# Patient Record
Sex: Male | Born: 1995 | Race: Black or African American | Hispanic: No | Marital: Married | Smoking: Never smoker
Health system: Southern US, Community
[De-identification: ages and names within clinical notes are randomized; demographics above are authoritative.]

## PROBLEM LIST (undated history)

## (undated) HISTORY — PX: HERNIA REPAIR: SHX51

## (undated) HISTORY — PX: EYE SURGERY: SHX253

---
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
## 2013-02-04 ENCOUNTER — Encounter (HOSPITAL_BASED_OUTPATIENT_CLINIC_OR_DEPARTMENT_OTHER): Payer: Self-pay | Admitting: *Deleted

## 2013-02-04 ENCOUNTER — Emergency Department (HOSPITAL_BASED_OUTPATIENT_CLINIC_OR_DEPARTMENT_OTHER): Payer: Medicaid Other

## 2013-02-04 ENCOUNTER — Emergency Department (HOSPITAL_BASED_OUTPATIENT_CLINIC_OR_DEPARTMENT_OTHER)
Admission: EM | Admit: 2013-02-04 | Discharge: 2013-02-04 | Disposition: A | Discharge status: Home / Self Care | Payer: Medicaid Other | Attending: Emergency Medicine | Admitting: Emergency Medicine

## 2013-02-04 DIAGNOSIS — M25569 Pain in unspecified knee: Secondary | ICD-10-CM | POA: Insufficient documentation

## 2013-02-04 DIAGNOSIS — Z87828 Personal history of other (healed) physical injury and trauma: Secondary | ICD-10-CM | POA: Insufficient documentation

## 2013-02-04 DIAGNOSIS — M25562 Pain in left knee: Secondary | ICD-10-CM

## 2013-02-04 MED ORDER — IBUPROFEN 800 MG PO TABS
ORAL_TABLET | ORAL | Status: AC
  Administered 2013-02-04: 800 mg
  Filled 2013-02-04: qty 1

## 2013-02-04 MED ORDER — DICLOFENAC SODIUM 75 MG PO TBEC: 75.0000 mg | DELAYED_RELEASE_TABLET | Freq: Two times a day (BID) | ORAL | Status: DC

## 2013-02-04 NOTE — ED Notes (Addendum)
Pt c/o of constant, throbbing lft medial knee pain that started 3 hrs ago. Pt was walking and heard a pop. Pt took a tylenol and percocet, pain was temporarily relieved. No swelling/bruising noted. No previous injury to left knee.

## 2013-02-04 NOTE — ED Provider Notes (Signed)
History     CSN: [id number]  Arrival date & time 02/04/13  2126   First MD Initiated Contact with Patient 02/04/13 2211      Chief Complaint  Patient presents with  . Knee Pain    (Consider location/radiation/quality/duration/timing/severity/associated sxs/prior treatment) Patient is a 17 y.o. male presenting with knee pain. The history is provided by the patient. No language interpreter was used.  Knee Pain Location:  Knee Injury: yes   Knee location:  L knee Pain details:    Quality:  Aching   Severity:  Moderate   Onset quality:  Gradual   Timing:  Constant Chronicity:  New Foreign body present:  No foreign bodies Ineffective treatments:  None tried Pt complains of pain in left knee.  Pt reports he has swelling and pain.  Pt reports he injured knee 2 years ago  History reviewed. No pertinent past medical history.  Past Surgical History  Procedure Laterality Date  . Eye surgery    . Hernia repair      No family history on file.  History  Substance Use Topics  . Smoking status: Never Smoker   . Smokeless tobacco: Not on file  . Alcohol Use: No      Review of Systems  Allergies  Mushroom ext cmplx(shiitake-reishi-mait) and Nutritional supplements  Home Medications   Current Outpatient Rx  Name  Route  Sig  Dispense  Refill  . Acetaminophen (TYLENOL 8 HOUR PO)   Oral   Take by mouth.         . Oxycodone-Acetaminophen (PERCOCET PO)   Oral   Take by mouth.         . promethazine (PHENERGAN) 25 MG tablet   Oral   Take 1 tablet (25 mg total) by mouth every 6 (six) hours as needed for nausea.   20 tablet   0     BP 130/78  Pulse 70  Temp(Src) 99.1 F (37.3 C) (Oral)  Resp 19  Ht 6' (1.829 m)  Wt 206 lb (93.441 kg)  BMI 27.93 kg/m2  SpO2 100%  Physical Exam  Vitals reviewed. Constitutional: He is oriented to person, place, and time. He appears well-developed and well-nourished.  HENT:  Head: Normocephalic.  Musculoskeletal: He  exhibits tenderness.  Swollen left knee,  From,  nv and ns intact  Neurological: He is alert and oriented to person, place, and time.  Skin: Skin is warm.  Psychiatric: He has a normal mood and affect.    ED Course  Procedures (including critical care time)  Labs Reviewed - No data to display Dg Knee Complete 4 Views Left  02/04/2013  *RADIOLOGY REPORT*  Clinical Data: Medial and anterior left knee pain and limited range of motion after hearing a pop.  LEFT KNEE - COMPLETE 4+ VIEW  Comparison: 03/16/2012  Findings: Patella alta, stable since previous study.  No significant effusion.  No evidence of acute fracture or subluxation.  No focal bone lesion or bone destruction.  Bone cortex and trabecular architecture appear intact.  No significant change since previous study.  IMPRESSION: Stable patella alta.  No acute fracture, dislocation, or effusion demonstrated.   Original Report Authenticated By: Burman Nieves, M.D.      No diagnosis found.    MDM  xrays reviewed with pt,   Pt has patella alta.   Pt has had multiple ED visits with multiple xrays         Elson Areas, PA-C 02/04/13 2303

## 2013-02-05 NOTE — ED Provider Notes (Signed)
Medical screening examination/treatment/procedure(s) were performed by non-physician practitioner and as supervising physician I was immediately available for consultation/collaboration.   Braedan Meuth III, MD 02/05/13 1026 

## 2013-03-29 ENCOUNTER — Encounter (HOSPITAL_BASED_OUTPATIENT_CLINIC_OR_DEPARTMENT_OTHER): Payer: Self-pay | Admitting: *Deleted

## 2013-03-29 ENCOUNTER — Emergency Department (HOSPITAL_BASED_OUTPATIENT_CLINIC_OR_DEPARTMENT_OTHER)
Admission: EM | Admit: 2013-03-29 | Discharge: 2013-03-29 | Disposition: A | Discharge status: Home / Self Care | Payer: Self-pay | Attending: Emergency Medicine | Admitting: Emergency Medicine

## 2013-03-29 DIAGNOSIS — H538 Other visual disturbances: Secondary | ICD-10-CM | POA: Insufficient documentation

## 2013-03-29 MED ORDER — FLUORESCEIN SODIUM 1 MG OP STRP
1.0000 | ORAL_STRIP | Freq: Once | OPHTHALMIC | Status: AC
  Administered 2013-03-29: 1 via OPHTHALMIC
  Filled 2013-03-29: qty 1

## 2013-03-29 MED ORDER — TETRACAINE HCL 0.5 % OP SOLN
2.0000 [drp] | Freq: Once | OPHTHALMIC | Status: AC
  Administered 2013-03-29: 2 [drp] via OPHTHALMIC
  Filled 2013-03-29: qty 2

## 2013-03-29 NOTE — ED Notes (Signed)
Called [facility] for Ophthamology requested by Langston Masker

## 2013-03-29 NOTE — ED Provider Notes (Signed)
Medical screening examination/treatment/procedure(s) were performed by non-physician practitioner and as supervising physician I was immediately available for consultation/collaboration.   Johnaton Sonneborn, MD 03/29/13 2252 

## 2013-03-29 NOTE — ED Notes (Signed)
Pt reports blurred vision, right eye, since 0700. Worse in sunlight. Denies pain. 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

## 2013-03-29 NOTE — ED Provider Notes (Signed)
History     CSN: [id number]  Arrival date & time 03/29/13  4098   First MD Initiated Contact with Patient 03/29/13 1831      Chief Complaint  Patient presents with  . Eye Problem    (Consider location/radiation/quality/duration/timing/severity/associated sxs/prior treatment) Patient is a 17 y.o. male presenting with eye problem. The history is provided by the patient. No language interpreter was used.  Eye Problem Location:  R eye Severity:  Mild Onset quality:  Sudden Timing:  Constant Chronicity:  New Relieved by:  Nothing Worsened by:  Nothing tried Associated symptoms: blurred vision and decreased vision     History reviewed. No pertinent past medical history.  Past Surgical History  Procedure Laterality Date  . Eye surgery    . Hernia repair      No family history on file.  History  Substance Use Topics  . Smoking status: Never Smoker   . Smokeless tobacco: Never Used  . Alcohol Use: No      Review of Systems  Eyes: Positive for blurred vision.  All other systems reviewed and are negative.    Allergies  Bee venom; Mushroom ext cmplx(shiitake-reishi-mait); and Nutritional supplements  Home Medications   Current Outpatient Rx  Name  Route  Sig  Dispense  Refill  . Acetaminophen (TYLENOL 8 HOUR PO)   Oral   Take by mouth.         . diclofenac (VOLTAREN) 75 MG EC tablet   Oral   Take 1 tablet (75 mg total) by mouth 2 (two) times daily.   14 tablet   0   . Oxycodone-Acetaminophen (PERCOCET PO)   Oral   Take by mouth.         . promethazine (PHENERGAN) 25 MG tablet   Oral   Take 1 tablet (25 mg total) by mouth every 6 (six) hours as needed for nausea.   20 tablet   0     BP 126/65  Pulse 65  Temp(Src) 99.2 F (37.3 C) (Oral)  Resp 16  Ht 6' (1.829 m)  Wt 208 lb (94.348 kg)  BMI 28.2 kg/m2  Physical Exam  Nursing note and vitals reviewed. Constitutional: He appears well-developed and well-nourished.  HENT:  Head:  Normocephalic and atraumatic.  Eyes: Conjunctivae, EOM and lids are normal. Pupils are equal, round, and reactive to light. No foreign bodies found. Right eye exhibits no chemosis, no discharge and no exudate. No foreign body present in the right eye.  Slit lamp exam:      The right eye shows no corneal abrasion, no corneal ulcer, no foreign body, no hyphema and no fluorescein uptake.  Neck: Normal range of motion. Neck supple.  Cardiovascular: Normal rate.   Musculoskeletal: Normal range of motion.  Neurological: He is alert.  Skin: Skin is warm.  Psychiatric: He has a normal mood and affect.    ED Course  Procedures (including critical care time)  Labs Reviewed - No data to display No results found.   1. Blurred vision, right eye       MDM  I discussed pt with Dr. Allyne Gee who will see and evaluate pt.          Lonia Skinner [place], PA-C 03/29/13 2003

## 2013-05-12 DIAGNOSIS — S8990XA Unspecified injury of unspecified lower leg, initial encounter: Secondary | ICD-10-CM | POA: Insufficient documentation

## 2013-05-12 DIAGNOSIS — G44309 Post-traumatic headache, unspecified, not intractable: Secondary | ICD-10-CM | POA: Insufficient documentation

## 2013-05-12 DIAGNOSIS — S99929A Unspecified injury of unspecified foot, initial encounter: Secondary | ICD-10-CM | POA: Insufficient documentation

## 2013-05-12 DIAGNOSIS — Y92410 Unspecified street and highway as the place of occurrence of the external cause: Secondary | ICD-10-CM | POA: Insufficient documentation

## 2013-05-12 DIAGNOSIS — S298XXA Other specified injuries of thorax, initial encounter: Secondary | ICD-10-CM | POA: Insufficient documentation

## 2013-05-12 DIAGNOSIS — Y9389 Activity, other specified: Secondary | ICD-10-CM | POA: Insufficient documentation

## 2013-05-13 ENCOUNTER — Emergency Department (HOSPITAL_COMMUNITY)
Admission: EM | Admit: 2013-05-13 | Discharge: 2013-05-13 | Disposition: A | Discharge status: Home / Self Care | Payer: No Typology Code available for payment source | Attending: Emergency Medicine | Admitting: Emergency Medicine

## 2013-05-13 ENCOUNTER — Emergency Department (HOSPITAL_COMMUNITY): Payer: Medicaid Other

## 2013-05-13 ENCOUNTER — Encounter (HOSPITAL_COMMUNITY): Payer: Self-pay | Admitting: Adult Health

## 2013-05-13 LAB — BASIC METABOLIC PANEL
Calcium: 9.7 mg/dL (ref 8.4–10.5)
GFR calc Af Amer: >90 mL/min (ref >90)
GFR calc non Af Amer: >90 mL/min (ref >90)
Potassium: 4.1 mEq/L (ref 3.5–5.1)
Sodium: 135 mEq/L (ref 135–145)

## 2013-05-13 LAB — CBC
MCH: 31.6 pg (ref 26.0–34.0)
MCHC: 34.9 g/dL (ref 30.0–36.0)
Platelets: 239 10*3/uL (ref 150–400)
RBC: 4.94 MIL/uL (ref 4.22–5.81)

## 2013-05-13 LAB — CG4 I-STAT (LACTIC ACID): Lactic Acid, Venous: 1.07 mmol/L (ref 0.5–2.2)

## 2013-05-13 MED ORDER — TRAMADOL HCL 50 MG PO TABS: 50.0000 mg | ORAL_TABLET | Freq: Four times a day (QID) | ORAL | Status: DC | PRN

## 2013-05-13 MED ORDER — IBUPROFEN 200 MG PO TABS
400.0000 mg | ORAL_TABLET | Freq: Once | ORAL | Status: AC
  Administered 2013-05-13: 400 mg via ORAL
  Filled 2013-05-13: qty 2

## 2013-05-13 MED ORDER — OXYCODONE-ACETAMINOPHEN 5-325 MG PO TABS
1.0000 | ORAL_TABLET | Freq: Once | ORAL | Status: AC
  Administered 2013-05-13: 1 via ORAL
  Filled 2013-05-13: qty 1
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
## 2013-05-13 NOTE — ED Notes (Signed)
Pt states he's taking frequent shallow breathes because a deep breathe cause too much pain.
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
## 2013-08-16 ENCOUNTER — Encounter (HOSPITAL_COMMUNITY): Payer: Self-pay

## 2013-08-16 ENCOUNTER — Emergency Department (HOSPITAL_COMMUNITY): Payer: Self-pay

## 2013-08-16 ENCOUNTER — Emergency Department (HOSPITAL_COMMUNITY)
Admission: EM | Admit: 2013-08-16 | Discharge: 2013-08-16 | Disposition: A | Discharge status: Home / Self Care | Payer: Medicaid Other | Attending: Emergency Medicine | Admitting: Emergency Medicine

## 2013-08-16 ENCOUNTER — Emergency Department (HOSPITAL_COMMUNITY): Payer: Medicaid Other

## 2013-08-16 DIAGNOSIS — R1033 Periumbilical pain: Secondary | ICD-10-CM | POA: Insufficient documentation

## 2013-08-16 DIAGNOSIS — R109 Unspecified abdominal pain: Secondary | ICD-10-CM

## 2013-08-16 DIAGNOSIS — R197 Diarrhea, unspecified: Secondary | ICD-10-CM | POA: Insufficient documentation

## 2013-08-16 DIAGNOSIS — R6884 Jaw pain: Secondary | ICD-10-CM

## 2013-08-16 LAB — URINALYSIS, ROUTINE W REFLEX MICROSCOPIC
Bilirubin Urine: NEGATIVE
Glucose, UA: NEGATIVE mg/dL
Ketones, ur: NEGATIVE mg/dL
Leukocytes, UA: NEGATIVE
Protein, ur: NEGATIVE mg/dL
Urobilinogen, UA: 0.2 mg/dL (ref 0.0–1.0)
pH: 6 (ref 5.0–8.0)

## 2013-08-16 LAB — CBC WITH DIFFERENTIAL/PLATELET
Basophils Absolute: 0 10*3/uL (ref 0.0–0.1)
Eosinophils Relative: 3 % (ref 0–5)
Lymphocytes Relative: 38 % (ref 12–46)
Neutro Abs: 2.9 10*3/uL (ref 1.7–7.7)
Neutrophils Relative %: 49 % (ref 43–77)
Platelets: 253 10*3/uL (ref 150–400)
RDW: 12.1 % (ref 11.5–15.5)
WBC: 5.8 10*3/uL (ref 4.0–10.5)

## 2013-08-16 LAB — BASIC METABOLIC PANEL
CO2: 23 mEq/L (ref 19–32)
Calcium: 10.7 mg/dL — ABNORMAL HIGH (ref 8.4–10.5)
Chloride: 102 mEq/L (ref 96–112)
Potassium: 4.2 mEq/L (ref 3.5–5.1)
Sodium: 137 mEq/L (ref 135–145)

## 2013-08-16 NOTE — ED Notes (Signed)
Pt c/o jaw pain x 2 days and lower abdominal pain w/ diarrhea x 4 days.  Pain score 6/10 and 2/10, respectively.  Sts "I woke up, yawned, heard a pop and now I'm having difficulty closing my mouth."

## 2013-08-16 NOTE — ED Provider Notes (Signed)
CSN: [id number]     Arrival date & time 08/16/13  1741 History   First MD Initiated Contact with Patient 08/16/13 2029     Chief Complaint  Patient presents with  . Jaw Pain  . Abdominal Pain  . Diarrhea   (Consider location/radiation/quality/duration/timing/severity/associated sxs/prior Treatment) HPI Comments: Patient presents with complaints of periumbilical abdominal pain which has been occurring off and on for the past 2 days. He denies any nausea, vomiting, diarrhea, constipation. He is having no fevers. He denies blood in his stool and denies any urinary complaints. He also has complaints of left jaw pain for the past 2 days. This started abruptly when he woke up 2 mornings ago and yawned. He felt a pop in his left jaw and had to manipulate the jaw to get it closed. It is now sore but states that his teeth line up properly and he is able to open and close his mouth with some discomfort.  Patient is a 17 y.o. male presenting with abdominal pain and diarrhea. The history is provided by the patient.  Abdominal Pain Pain location:  Periumbilical Pain quality: cramping   Pain radiates to:  Does not radiate Pain severity:  Moderate Onset quality:  Sudden Duration:  2 days Timing:  Intermittent Progression:  Resolved Chronicity:  New Relieved by:  Nothing Worsened by:  Nothing tried Ineffective treatments:  None tried Associated symptoms: diarrhea   Diarrhea Associated symptoms: abdominal pain     History reviewed. No pertinent past medical history. Past Surgical History  Procedure Laterality Date  . Eye surgery    . Hernia repair     History reviewed. No pertinent family history. History  Substance Use Topics  . Smoking status: Never Smoker   . Smokeless tobacco: Never Used  . Alcohol Use: No    Review of Systems  Gastrointestinal: Positive for abdominal pain and diarrhea.  All other systems reviewed and are negative.    Allergies  Bee venom and Mushroom ext  cmplx(shiitake-reishi-mait)  Home Medications   Current Outpatient Rx  Name  Route  Sig  Dispense  Refill  . Pseudoephedrine-APAP-DM (DAY-TIME PO)   Oral   Take 15 mLs by mouth every 4 (four) hours as needed (cold symptoms).          BP 136/79  Pulse 71  Temp(Src) 99.1 F (37.3 C) (Oral)  Resp 16  SpO2 100% Physical Exam  Nursing note and vitals reviewed. Constitutional: He is oriented to person, place, and time. He appears well-developed and well-nourished. No distress.  HENT:  Head: Normocephalic and atraumatic.  Mouth/Throat: Oropharynx is clear and moist.  The jaw appears grossly normal. There is no malocclusion of the teeth. There is mild tenderness to palpation over the left temporomandibular joint with no crepitus to  Neck: Normal range of motion. Neck supple.  Cardiovascular: Normal rate, regular rhythm and normal heart sounds.   No murmur heard. Pulmonary/Chest: Effort normal and breath sounds normal. No respiratory distress. He has no wheezes.  Abdominal: Soft. Bowel sounds are normal. He exhibits no distension. There is no tenderness.  Musculoskeletal: Normal range of motion. He exhibits no edema.  Neurological: He is alert and oriented to person, place, and time.  Skin: Skin is warm and dry. He is not diaphoretic.    ED Course  Procedures (including critical care time) Labs Review Labs Reviewed  CBC WITH DIFFERENTIAL  BASIC METABOLIC PANEL  URINALYSIS, ROUTINE W REFLEX MICROSCOPIC   Imaging Review Dg Orthopantogram  08/16/2013  CLINICAL DATA:  Popping sensation over the left jaw, no trauma  EXAM: ORTHOPANTOGRAM/PANORAMIC  COMPARISON:  None  FINDINGS: Evidence of previous root canal and crown placement at the lower left 1st molar. Mandibular condyles and rami appear unremarkable. No fracture identified. Visualized paranasal sinuses are clear.  IMPRESSION: No mandibular fracture or other acute abnormality identified.   Electronically Signed   By: Christiana Pellant M.D.   On: 08/16/2013 20:00    MDM  No diagnosis found. Patient presents here with complaints of jaw pain that appears to be related to a possible partial dislocation while yawning several days ago. His teeth line up appropriately and x-rays are negative. No further workup is indicated for this with the exception of anti-inflammatories and avoid chewing hard, tough foods  Is also complaining of abdominal pain that he had 2 days ago and then resolved. It returned today for a brief period of time and then went away again. Workup reveals no elevation of white count, normal urine, and normal electrolytes. I do not feel as though further workup is indicated into this at this point. I doubt appendicitis as there is no right lower quadrant tenderness and no white count. He will be discharged to home with instructions to return as needed if his symptoms worsen or change.    Geoffery Lyons, MD 08/16/13 [phone]

## 2013-11-01 ENCOUNTER — Emergency Department (HOSPITAL_COMMUNITY): Payer: Self-pay

## 2013-11-01 ENCOUNTER — Emergency Department (HOSPITAL_COMMUNITY)
Admission: EM | Admit: 2013-11-01 | Discharge: 2013-11-01 | Disposition: A | Discharge status: Home / Self Care | Payer: Self-pay | Attending: Emergency Medicine | Admitting: Emergency Medicine

## 2013-11-01 ENCOUNTER — Encounter (HOSPITAL_COMMUNITY): Payer: Self-pay | Admitting: Emergency Medicine

## 2013-11-01 DIAGNOSIS — R059 Cough, unspecified: Secondary | ICD-10-CM

## 2013-11-01 DIAGNOSIS — R079 Chest pain, unspecified: Secondary | ICD-10-CM | POA: Insufficient documentation

## 2013-11-01 DIAGNOSIS — R112 Nausea with vomiting, unspecified: Secondary | ICD-10-CM | POA: Insufficient documentation

## 2013-11-01 DIAGNOSIS — R05 Cough: Secondary | ICD-10-CM

## 2013-11-01 DIAGNOSIS — J069 Acute upper respiratory infection, unspecified: Secondary | ICD-10-CM | POA: Insufficient documentation

## 2013-11-01 MED ORDER — PSEUDOEPHEDRINE HCL ER 120 MG PO TB12: 120.0000 mg | ORAL_TABLET | Freq: Two times a day (BID) | ORAL | Status: DC

## 2013-11-01 NOTE — ED Notes (Signed)
Pt has a productive cough and chest wall pain for two days
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
## 2013-11-01 NOTE — ED Notes (Signed)
Pt transported to xray 

## 2013-11-01 NOTE — ED Provider Notes (Signed)
Medical screening examination/treatment/procedure(s) were performed by non-physician practitioner and as supervising physician I was immediately available for consultation/collaboration.  EKG Interpretation   None         Audree Camel, MD 11/01/13 2222

## 2013-11-01 NOTE — ED Notes (Signed)
Pt states 2 days ago he woke up with congestion  Pt states when he coughed it made his chest hurt  Pt states he has had a lot of phlegm and a sore throat  Pt states he vomited twice last night at work  Pt states his chest pain gets worse with cough

## 2014-01-23 ENCOUNTER — Emergency Department (HOSPITAL_COMMUNITY)
Admission: EM | Admit: 2014-01-23 | Discharge: 2014-01-23 | Disposition: A | Discharge status: Home / Self Care | Payer: Medicaid Other | Attending: Emergency Medicine | Admitting: Emergency Medicine

## 2014-01-23 ENCOUNTER — Encounter (HOSPITAL_COMMUNITY): Payer: Self-pay | Admitting: Emergency Medicine

## 2014-01-23 DIAGNOSIS — R69 Illness, unspecified: Secondary | ICD-10-CM

## 2014-01-23 DIAGNOSIS — J111 Influenza due to unidentified influenza virus with other respiratory manifestations: Secondary | ICD-10-CM

## 2014-01-23 DIAGNOSIS — R630 Anorexia: Secondary | ICD-10-CM | POA: Insufficient documentation

## 2014-01-23 DIAGNOSIS — G43909 Migraine, unspecified, not intractable, without status migrainosus: Secondary | ICD-10-CM

## 2014-01-23 LAB — I-STAT CHEM 8, ED
BUN: 12 mg/dL (ref 6–23)
Calcium, Ion: 1.06 mmol/L — ABNORMAL LOW (ref 1.12–1.23)
Chloride: 101 mEq/L (ref 96–112)
Creatinine, Ser: 1.2 mg/dL (ref 0.50–1.35)
Glucose, Bld: 84 mg/dL (ref 70–99)
HEMATOCRIT: 52 % (ref 39.0–52.0)
HEMOGLOBIN: 17.7 g/dL — AB (ref 13.0–17.0)
POTASSIUM: 3.7 meq/L (ref 3.7–5.3)
SODIUM: 136 meq/L — AB (ref 137–147)
TCO2: 22 mmol/L (ref 0–100)

## 2014-01-23 MED ORDER — METOCLOPRAMIDE HCL 5 MG/ML IJ SOLN
10.0000 mg | Freq: Once | INTRAMUSCULAR | Status: AC
  Administered 2014-01-23: 10 mg via INTRAVENOUS
  Filled 2014-01-23: qty 2

## 2014-01-23 MED ORDER — DEXAMETHASONE SODIUM PHOSPHATE 10 MG/ML IJ SOLN
10.0000 mg | Freq: Once | INTRAMUSCULAR | Status: AC
  Administered 2014-01-23: 10 mg via INTRAVENOUS
  Filled 2014-01-23: qty 1

## 2014-01-23 MED ORDER — KETOROLAC TROMETHAMINE 15 MG/ML IJ SOLN
30.0000 mg | Freq: Once | INTRAMUSCULAR | Status: AC
  Administered 2014-01-23: 30 mg via INTRAVENOUS
  Filled 2014-01-23: qty 2

## 2014-01-23 MED ORDER — DIPHENHYDRAMINE HCL 50 MG/ML IJ SOLN
25.0000 mg | Freq: Once | INTRAMUSCULAR | Status: AC
  Administered 2014-01-23: 25 mg via INTRAVENOUS
  Filled 2014-01-23: qty 1

## 2014-01-23 MED ORDER — SODIUM CHLORIDE 0.9 % IV BOLUS (SEPSIS)
1000.0000 mL | Freq: Once | INTRAVENOUS | Status: AC
  Administered 2014-01-23: 1000 mL via INTRAVENOUS

## 2014-01-23 NOTE — ED Notes (Signed)
Pt reports generalized body aches, HA, weakness and dizziness for the past week with nasal congestion. Pt a&o x4, ambulatory to triage, NAD noted at this time

## 2014-01-23 NOTE — Discharge Instructions (Signed)
Influenza, Adult Influenza ("the flu") is a viral infection of the respiratory tract. It occurs more often in winter months because people spend more time in close contact with one another. Influenza can make you feel very sick. Influenza easily spreads from person to person (contagious). CAUSES  Influenza is caused by a virus that infects the respiratory tract. You can catch the virus by breathing in droplets from an infected person's cough or sneeze. You can also catch the virus by touching something that was recently contaminated with the virus and then touching your mouth, nose, or eyes. SYMPTOMS  Symptoms typically last 4 to 10 days and may include:  Fever.  Chills.  Headache, body aches, and muscle aches.  Sore throat.  Chest discomfort and cough.  Poor appetite.  Weakness or feeling tired.  Dizziness.  Nausea or vomiting. DIAGNOSIS  Diagnosis of influenza is often made based on your history and a physical exam. A nose or throat swab test can be done to confirm the diagnosis. RISKS AND COMPLICATIONS You may be at risk for a more severe case of influenza if you smoke cigarettes, have diabetes, have chronic heart disease (such as heart failure) or lung disease (such as asthma), or if you have a weakened immune system. Elderly people and pregnant women are also at risk for more serious infections. The most common complication of influenza is a lung infection (pneumonia). Sometimes, this complication can require emergency medical care and may be life-threatening. PREVENTION  An annual influenza vaccination (flu shot) is the best way to avoid getting influenza. An annual flu shot is now routinely recommended for all adults in the [place] TREATMENT  In mild cases, influenza goes away on its own. Treatment is directed at relieving symptoms. For more severe cases, your caregiver may prescribe antiviral medicines to shorten the sickness. Antibiotic medicines are not effective, because the  infection is caused by a virus, not by bacteria. HOME CARE INSTRUCTIONS  Only take over-the-counter or prescription medicines for pain, discomfort, or fever as directed by your caregiver.  Use a cool mist humidifier to make breathing easier.  Get plenty of rest until your temperature returns to normal. This usually takes 3 to 4 days.  Drink enough fluids to keep your urine clear or pale yellow.  Cover your mouth and nose when coughing or sneezing, and wash your hands well to avoid spreading the virus.  Stay home from work or school until your fever has been gone for at least 1 full day. SEEK MEDICAL CARE IF:   You have chest pain or a deep cough that worsens or produces more mucus.  You have nausea, vomiting, or diarrhea. SEEK IMMEDIATE MEDICAL CARE IF:   You have difficulty breathing, shortness of breath, or your skin or nails turn bluish.  You have severe neck pain or stiffness.  You have a severe headache, facial pain, or earache.  You have a worsening or recurring fever.  You have nausea or vomiting that cannot be controlled. MAKE SURE YOU:  Understand these instructions.  Will watch your condition.  Will get help right away if you are not doing well or get worse. Document Released: 11/05/2000 Document Revised: 05/09/2012 Document Reviewed: 02/07/2012 [facility] Patient Information 2014 [place], [place]. Migraine Headache A migraine headache is an intense, throbbing pain on one or both sides of your head. A migraine can last for 30 minutes to several hours. CAUSES  The exact cause of a migraine headache is not always known. However, a migraine may  be caused when nerves in the brain become irritated and release chemicals that cause inflammation. This causes pain. Certain things may also trigger migraines, such as:  Alcohol.  Smoking.  Stress.  Menstruation.  Aged cheeses.  Foods or drinks that contain nitrates, glutamate, aspartame, or tyramine.  Lack of  sleep.  Chocolate.  Caffeine.  Hunger.  Physical exertion.  Fatigue.  Medicines used to treat chest pain (nitroglycerine), birth control pills, estrogen, and some blood pressure medicines. SIGNS AND SYMPTOMS  Pain on one or both sides of your head.  Pulsating or throbbing pain.  Severe pain that prevents daily activities.  Pain that is aggravated by any physical activity.  Nausea, vomiting, or both.  Dizziness.  Pain with exposure to bright lights, loud noises, or activity.  General sensitivity to bright lights, loud noises, or smells. Before you get a migraine, you may get warning signs that a migraine is coming (aura). An aura may include:  Seeing flashing lights.  Seeing bright spots, halos, or zig-zag lines.  Having tunnel vision or blurred vision.  Having feelings of numbness or tingling.  Having trouble talking.  Having muscle weakness. DIAGNOSIS  A migraine headache is often diagnosed based on:  Symptoms.  Physical exam.  A CT scan or MRI of your head. These imaging tests cannot diagnose migraines, but they can help rule out other causes of headaches. TREATMENT Medicines may be given for pain and nausea. Medicines can also be given to help prevent recurrent migraines.  HOME CARE INSTRUCTIONS  Only take over-the-counter or prescription medicines for pain or discomfort as directed by your health care provider. The use of long-term narcotics is not recommended.  Lie down in a dark, quiet room when you have a migraine.  Keep a journal to find out what may trigger your migraine headaches. For example, write down:  What you eat and drink.  How much sleep you get.  Any change to your diet or medicines.  Limit alcohol consumption.  Quit smoking if you smoke.  Get 7 9 hours of sleep, or as recommended by your health care provider.  Limit stress.  Keep lights dim if bright lights bother you and make your migraines worse. SEEK IMMEDIATE MEDICAL  CARE IF:   Your migraine becomes severe.  You have a fever.  You have a stiff neck.  You have vision loss.  You have muscular weakness or loss of muscle control.  You start losing your balance or have trouble walking.  You feel faint or pass out.  You have severe symptoms that are different from your first symptoms. MAKE SURE YOU:   Understand these instructions.  Will watch your condition.  Will get help right away if you are not doing well or get worse. Document Released: 11/08/2005 Document Revised: 08/29/2013 Document Reviewed: 07/16/2013 [facility] Patient Information 2014 [place], [place].

## 2014-01-23 NOTE — ED Notes (Signed)
MD at bedside at this time.

## 2014-01-23 NOTE — ED Notes (Signed)
Pt's mother called concerned about patients wait and why he was still in the waiting room, it was explained to her that we were doing the best we can and that we are watching the waiting room and the patients.
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
## 2014-08-11 ENCOUNTER — Encounter (HOSPITAL_BASED_OUTPATIENT_CLINIC_OR_DEPARTMENT_OTHER): Payer: Self-pay | Admitting: Emergency Medicine

## 2014-08-11 ENCOUNTER — Emergency Department (HOSPITAL_BASED_OUTPATIENT_CLINIC_OR_DEPARTMENT_OTHER)
Admission: EM | Admit: 2014-08-11 | Discharge: 2014-08-11 | Disposition: A | Discharge status: Home / Self Care | Payer: Medicaid Other | Attending: Emergency Medicine | Admitting: Emergency Medicine

## 2014-08-11 DIAGNOSIS — Z79899 Other long term (current) drug therapy: Secondary | ICD-10-CM | POA: Insufficient documentation

## 2014-08-11 DIAGNOSIS — S139XXA Sprain of joints and ligaments of unspecified parts of neck, initial encounter: Secondary | ICD-10-CM | POA: Insufficient documentation

## 2014-08-11 DIAGNOSIS — Y939 Activity, unspecified: Secondary | ICD-10-CM | POA: Insufficient documentation

## 2014-08-11 DIAGNOSIS — M542 Cervicalgia: Secondary | ICD-10-CM | POA: Insufficient documentation

## 2014-08-11 DIAGNOSIS — Y929 Unspecified place or not applicable: Secondary | ICD-10-CM | POA: Insufficient documentation

## 2014-08-11 DIAGNOSIS — X58XXXA Exposure to other specified factors, initial encounter: Secondary | ICD-10-CM | POA: Insufficient documentation

## 2014-08-11 DIAGNOSIS — S161XXA Strain of muscle, fascia and tendon at neck level, initial encounter: Secondary | ICD-10-CM

## 2014-08-11 MED ORDER — IBUPROFEN 600 MG PO TABS: 600.0000 mg | ORAL_TABLET | Freq: Four times a day (QID) | ORAL | Status: DC | PRN

## 2014-08-11 MED ORDER — IBUPROFEN 400 MG PO TABS
600.0000 mg | ORAL_TABLET | Freq: Once | ORAL | Status: AC
  Administered 2014-08-11: 600 mg via ORAL
  Filled 2014-08-11 (×2): qty 1

## 2014-08-11 MED ORDER — METHOCARBAMOL 500 MG PO TABS
1000.0000 mg | ORAL_TABLET | Freq: Once | ORAL | Status: AC
  Administered 2014-08-11: 1000 mg via ORAL
  Filled 2014-08-11: qty 2

## 2014-08-11 MED ORDER — METHOCARBAMOL 500 MG PO TABS: 1000.0000 mg | ORAL_TABLET | Freq: Two times a day (BID) | ORAL | Status: DC

## 2014-08-11 NOTE — Discharge Instructions (Signed)
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
## 2014-08-11 NOTE — ED Notes (Addendum)
Pt reports neck pain x2 days, Denies fever, no n/v/d noted. Pt denies injury. Pt states he is with his family members who are here to be seen as well.

## 2014-11-17 ENCOUNTER — Emergency Department: Payer: Self-pay | Admitting: Emergency Medicine

## 2014-11-21 ENCOUNTER — Emergency Department: Payer: Self-pay | Admitting: Student

## 2014-11-23 LAB — BETA STREP CULTURE(ARMC)

## 2015-06-21 ENCOUNTER — Emergency Department: Payer: Medicaid Other

## 2015-06-21 ENCOUNTER — Encounter: Payer: Self-pay | Admitting: Emergency Medicine

## 2015-06-21 ENCOUNTER — Emergency Department
Admission: EM | Admit: 2015-06-21 | Discharge: 2015-06-21 | Disposition: A | Discharge status: Home / Self Care | Payer: Medicaid Other | Attending: Emergency Medicine | Admitting: Emergency Medicine

## 2015-06-21 ENCOUNTER — Emergency Department: Payer: No Typology Code available for payment source

## 2015-06-21 DIAGNOSIS — S4991XA Unspecified injury of right shoulder and upper arm, initial encounter: Secondary | ICD-10-CM | POA: Insufficient documentation

## 2015-06-21 DIAGNOSIS — Y92410 Unspecified street and highway as the place of occurrence of the external cause: Secondary | ICD-10-CM | POA: Insufficient documentation

## 2015-06-21 DIAGNOSIS — Y9389 Activity, other specified: Secondary | ICD-10-CM | POA: Insufficient documentation

## 2015-06-21 DIAGNOSIS — Y998 Other external cause status: Secondary | ICD-10-CM | POA: Insufficient documentation

## 2015-06-21 DIAGNOSIS — S8991XA Unspecified injury of right lower leg, initial encounter: Secondary | ICD-10-CM | POA: Insufficient documentation

## 2015-06-21 DIAGNOSIS — Z79899 Other long term (current) drug therapy: Secondary | ICD-10-CM | POA: Insufficient documentation

## 2015-06-21 DIAGNOSIS — Z720 Tobacco use: Secondary | ICD-10-CM | POA: Insufficient documentation

## 2015-06-21 DIAGNOSIS — M79604 Pain in right leg: Secondary | ICD-10-CM

## 2015-06-21 DIAGNOSIS — S99911A Unspecified injury of right ankle, initial encounter: Secondary | ICD-10-CM | POA: Insufficient documentation

## 2015-06-21 MED ORDER — CYCLOBENZAPRINE HCL 10 MG PO TABS: 10.0000 mg | ORAL_TABLET | Freq: Three times a day (TID) | ORAL | Status: DC | PRN

## 2015-06-21 MED ORDER — NAPROXEN 500 MG PO TBEC: 500.0000 mg | DELAYED_RELEASE_TABLET | Freq: Two times a day (BID) | ORAL | Status: DC
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
## 2015-06-21 NOTE — Discharge Instructions (Signed)
Musculoskeletal Pain °Musculoskeletal pain is muscle and boney aches and pains. These pains can occur in any part of the body. Your caregiver may treat you without knowing the cause of the pain. They may treat you if blood or urine tests, X-rays, and other tests were normal.  °CAUSES °There is often not a definite cause or reason for these pains. These pains may be caused by a type of germ (virus). The discomfort may also come from overuse. Overuse includes working out too hard when your body is not fit. Boney aches also come from weather changes. Bone is sensitive to atmospheric pressure changes. °HOME CARE INSTRUCTIONS  °· Ask when your test results will be ready. Make sure you get your test results. °· Only take over-the-counter or prescription medicines for pain, discomfort, or fever as directed by your caregiver. If you were given medications for your condition, do not drive, operate machinery or power tools, or sign legal documents for 24 hours. Do not drink alcohol. Do not take sleeping pills or other medications that may interfere with treatment. °· Continue all activities unless the activities cause more pain. When the pain lessens, slowly resume normal activities. Gradually increase the intensity and duration of the activities or exercise. °· During periods of severe pain, bed rest may be helpful. Lay or sit in any position that is comfortable. °· Putting ice on the injured area. °¨ Put ice in a bag. °¨ Place a towel between your skin and the bag. °¨ Leave the ice on for 15 to 20 minutes, 3 to 4 times a day. °· Follow up with your caregiver for continued problems and no reason can be found for the pain. If the pain becomes worse or does not go away, it may be necessary to repeat tests or do additional testing. Your caregiver may need to look further for a possible cause. °SEEK IMMEDIATE MEDICAL CARE IF: °· You have pain that is getting worse and is not relieved by medications. °· You develop chest pain  that is associated with shortness or breath, sweating, feeling sick to your stomach (nauseous), or throw up (vomit). °· Your pain becomes localized to the abdomen. °· You develop any new symptoms that seem different or that concern you. °MAKE SURE YOU:  °· Understand these instructions. °· Will watch your condition. °· Will get help right away if you are not doing well or get worse. °Document Released: 11/08/2005 Document Revised: 01/31/2012 Document Reviewed: 07/13/2013 °[employer]® Patient Information ©2015 [employer]. This information is not intended to replace advice given to you by your health care provider. Make sure you discuss any questions you have with your health care provider. ° °Motor Vehicle Collision °It is common to have multiple bruises and sore muscles after a motor vehicle collision (MVC). These tend to feel worse for the first 24 hours. You may have the most stiffness and soreness over the first several hours. You may also feel worse when you wake up the first morning after your collision. After this point, you will usually begin to improve with each day. The speed of improvement often depends on the severity of the collision, the number of injuries, and the location and nature of these injuries. °HOME CARE INSTRUCTIONS °· Put ice on the injured area. °¨ Put ice in a plastic bag. °¨ Place a towel between your skin and the bag. °¨ Leave the ice on for 15-20 minutes, 3-4 times a day, or as directed by your health care provider. °· Drink   enough fluids to keep your urine clear or pale yellow. Do not drink alcohol. °· Take a warm shower or bath once or twice a day. This will increase blood flow to sore muscles. °· You may return to activities as directed by your caregiver. Be careful when lifting, as this may aggravate neck or back pain. °· Only take over-the-counter or prescription medicines for pain, discomfort, or fever as directed by your caregiver. Do not use aspirin. This may increase bruising  and bleeding. °SEEK IMMEDIATE MEDICAL CARE IF: °· You have numbness, tingling, or weakness in the arms or legs. °· You develop severe headaches not relieved with medicine. °· You have severe neck pain, especially tenderness in the middle of the back of your neck. °· You have changes in bowel or bladder control. °· There is increasing pain in any area of the body. °· You have shortness of breath, light-headedness, dizziness, or fainting. °· You have chest pain. °· You feel sick to your stomach (nauseous), throw up (vomit), or sweat. °· You have increasing abdominal discomfort. °· There is blood in your urine, stool, or vomit. °· You have pain in your shoulder (shoulder strap areas). °· You feel your symptoms are getting worse. °MAKE SURE YOU: °· Understand these instructions. °· Will watch your condition. °· Will get help right away if you are not doing well or get worse. °Document Released: 11/08/2005 Document Revised: 03/25/2014 Document Reviewed: 04/07/2011 °[employer]® Patient Information ©2015 [employer]. This information is not intended to replace advice given to you by your health care provider. Make sure you discuss any questions you have with your health care provider. ° °

## 2015-08-14 ENCOUNTER — Encounter: Payer: Self-pay | Admitting: *Deleted

## 2015-08-14 DIAGNOSIS — Z791 Long term (current) use of non-steroidal anti-inflammatories (NSAID): Secondary | ICD-10-CM | POA: Insufficient documentation

## 2015-08-14 DIAGNOSIS — Z79899 Other long term (current) drug therapy: Secondary | ICD-10-CM | POA: Insufficient documentation

## 2015-08-14 DIAGNOSIS — R51 Headache: Secondary | ICD-10-CM | POA: Insufficient documentation

## 2015-08-14 DIAGNOSIS — G8929 Other chronic pain: Secondary | ICD-10-CM | POA: Insufficient documentation

## 2015-08-14 NOTE — ED Notes (Signed)
Pt reports back of head feels "hot", it will come intermittently and then has had headache since.

## 2015-08-15 ENCOUNTER — Emergency Department
Admission: EM | Admit: 2015-08-15 | Discharge: 2015-08-15 | Disposition: A | Discharge status: Home / Self Care | Payer: Medicaid Other | Attending: Emergency Medicine | Admitting: Emergency Medicine

## 2015-08-15 DIAGNOSIS — R51 Headache: Secondary | ICD-10-CM

## 2015-08-15 DIAGNOSIS — R519 Headache, unspecified: Secondary | ICD-10-CM

## 2015-08-15 MED ORDER — OXYCODONE-ACETAMINOPHEN 5-325 MG PO TABS
1.0000 | ORAL_TABLET | Freq: Once | ORAL | Status: AC
  Administered 2015-08-15: 1 via ORAL
  Filled 2015-08-15: qty 1

## 2015-08-15 NOTE — ED Provider Notes (Signed)
[facility] Emergency Department Provider Note  ____________________________________________  Time seen: 3:00 AM  I have reviewed the triage vital signs and the nursing notes.   HISTORY  Chief Complaint Headache      HPI Cameron Moore is a 19 y.o. male presents with history of chronic headaches however this headache in particular his been there for the past 4 days per patient. Patient states his current pain score is 4 out of 10 and located in the occipital region of his head. Patient states at times he feels hot or cold on and off on the back of his head. Patient denies any weakness no numbness no nausea no vomiting patient does however admit to positive photophobia.     Past medical history None There are no active problems to display for this patient.   Past Surgical History  Procedure Laterality Date  . Eye surgery    . Hernia repair      Current Outpatient Rx  Name  Route  Sig  Dispense  Refill  . cyclobenzaprine (FLEXERIL) 10 MG tablet   Oral   Take 1 tablet (10 mg total) by mouth every 8 (eight) hours as needed for muscle spasms.   30 tablet   1   . DM-Phenylephrine-Acetaminophen (VICKS DAYQUIL MULTI-SYMPTOM) 10-5-325 MG/15ML LIQD   Oral   Take 30 mLs by mouth every 6 (six) hours as needed (cough).         Marland Kitchen ibuprofen (ADVIL,MOTRIN) 600 MG tablet   Oral   Take 1 tablet (600 mg total) by mouth every 6 (six) hours as needed.   30 tablet   0   . methocarbamol (ROBAXIN) 500 MG tablet   Oral   Take 2 tablets (1,000 mg total) by mouth 2 (two) times daily.   30 tablet   0   . naproxen (EC NAPROSYN) 500 MG EC tablet   Oral   Take 1 tablet (500 mg total) by mouth 2 (two) times daily with a meal.   60 tablet   0     Allergies Bee venom and Mushroom ext cmplx(shiitake-reishi-mait)  Family History  Problem Relation Age of Onset  . Diabetes Mother     Social History Social History  Substance Use Topics  . Smoking  status: Never Smoker   . Smokeless tobacco: Never Used  . Alcohol Use: Yes    Review of Systems  Constitutional: Negative for fever. Eyes: Negative for visual changes. ENT: Negative for sore throat. Cardiovascular: Negative for chest pain. Respiratory: Negative for shortness of breath. Gastrointestinal: Negative for abdominal pain, vomiting and diarrhea. Genitourinary: Negative for dysuria. Musculoskeletal: Negative for back pain. Skin: Negative for rash. Neurological: Negative for headaches, focal weakness or numbness.   10-point ROS otherwise negative.  ____________________________________________   PHYSICAL EXAM:  VITAL SIGNS: ED Triage Vitals  Enc Vitals Group     BP 08/14/15 2334 135/76 mmHg     Pulse Rate 08/14/15 2334 69     Resp 08/14/15 2334 18     Temp 08/14/15 2334 98.6 F (37 C)     Temp Source 08/14/15 2334 Oral     SpO2 08/14/15 2334 98 %     Weight 08/14/15 2334 280 lb (127.007 kg)     Height 08/14/15 2334 6' (1.829 m)     Head Cir --      Peak Flow --      Pain Score 08/14/15 2336 6     Pain Loc --  Pain Edu? --      Excl. in GC? --     Constitutional: Alert and oriented. Well appearing and in no distress. Eyes: Conjunctivae are normal. PERRL. Normal extraocular movements. ENT   Head: Normocephalic and atraumatic.   Nose: No congestion/rhinnorhea.   Mouth/Throat: Mucous membranes are moist.   Neck: No stridor. Hematological/Lymphatic/Immunilogical: No cervical lymphadenopathy. Cardiovascular: Normal rate, regular rhythm. Normal and symmetric distal pulses are present in all extremities. No murmurs, rubs, or gallops. Respiratory: Normal respiratory effort without tachypnea nor retractions. Breath sounds are clear and equal bilaterally. No wheezes/rales/rhonchi. Gastrointestinal: Soft and nontender. No distention. There is no CVA tenderness. Genitourinary: deferred Musculoskeletal: Nontender with normal range of motion in all  extremities. No joint effusions.  No lower extremity tenderness nor edema. Neurologic:  Normal speech and language. No gross focal neurologic deficits are appreciated. Speech is normal.  Skin:  Skin is warm, dry and intact. No rash noted. Psychiatric: Mood and affect are normal. Speech and behavior are normal. Patient exhibits appropriate insight and judgment.    INITIAL IMPRESSION / ASSESSMENT AND PLAN / ED COURSE  Pertinent labs & imaging results that were available during my care of the patient were reviewed by me and considered in my medical decision making (see chart for details).  History of physical exam consistent with possible migraine headache. Review of the patient's chart revealed that he's had multiple CT scans of the head. Given no focal neurological deficits in the setting of multiple CT scans which were all negative in the past CT scan of the patient's head was not performed today. Patient is advised to follow-up with Dr. Darrick Huntsman for outpatient evaluation for chronic headaches  ____________________________________________   FINAL CLINICAL IMPRESSION(S) / ED DIAGNOSES  Final diagnoses:  Acute nonintractable headache, unspecified headache type      Darci Current, MD 08/15/15 [phone]

## 2015-08-15 NOTE — Discharge Instructions (Signed)

## 2015-08-15 NOTE — ED Notes (Signed)
Pt here with headache, pt states that the back of his head feels cold on/off. Pt in NAD at this time.

## 2015-09-11 ENCOUNTER — Emergency Department
Admission: EM | Admit: 2015-09-11 | Discharge: 2015-09-11 | Disposition: A | Discharge status: Home / Self Care | Payer: Medicaid Other | Attending: Emergency Medicine | Admitting: Emergency Medicine

## 2015-09-11 ENCOUNTER — Encounter: Payer: Self-pay | Admitting: Emergency Medicine

## 2015-09-11 DIAGNOSIS — L600 Ingrowing nail: Secondary | ICD-10-CM

## 2015-09-11 DIAGNOSIS — Z79899 Other long term (current) drug therapy: Secondary | ICD-10-CM | POA: Insufficient documentation

## 2015-09-11 MED ORDER — IBUPROFEN 800 MG PO TABS: 800.0000 mg | ORAL_TABLET | Freq: Three times a day (TID) | ORAL | Status: DC

## 2015-09-11 MED ORDER — HYDROCODONE-ACETAMINOPHEN 5-325 MG PO TABS
1.0000 | ORAL_TABLET | Freq: Once | ORAL | Status: AC
  Administered 2015-09-11: 1 via ORAL
  Filled 2015-09-11: qty 1

## 2015-09-11 MED ORDER — CEPHALEXIN 500 MG PO CAPS: 500.0000 mg | ORAL_CAPSULE | Freq: Four times a day (QID) | ORAL | Status: DC

## 2015-09-11 NOTE — Discharge Instructions (Signed)
Ingrown Toenail  An ingrown toenail occurs when the corner or sides of your toenail grow into the surrounding skin. The big toe is most commonly affected, but it can happen to any of your toes. If your ingrown toenail is not treated, you will be at risk for infection.  CAUSES  This condition may be caused by:  · Wearing shoes that are too small or tight.  · Injury or trauma, such as stubbing your toe or having your toe stepped on.  · Improper cutting or care of your toenails.  · Being born with (congenital) nail or foot abnormalities, such as having a nail that is too big for your toe.  RISK FACTORS  Risk factors for an ingrown toenail include:  · Age. Your nails tend to thicken as you get older, so ingrown nails are more common in older people.  · Diabetes.  · Cutting your toenails incorrectly.  · Blood circulation problems.  SYMPTOMS  Symptoms may include:  · Pain, soreness, or tenderness.  · Redness.  · Swelling.  · Hardening of the skin surrounding the toe.  Your ingrown toenail may be infected if there is fluid, pus, or drainage.  DIAGNOSIS   An ingrown toenail may be diagnosed by medical history and physical exam. If your toenail is infected, your health care provider may test a sample of the drainage.  TREATMENT  Treatment depends on the severity of your ingrown toenail. Some ingrown toenails may be treated at home. More severe or infected ingrown toenails may require surgery to remove all or part of the nail. Infected ingrown toenails may also be treated with antibiotic medicines.  HOME CARE INSTRUCTIONS  · If you were prescribed an antibiotic medicine, finish all of it even if you start to feel better.  · Soak your foot in warm soapy water for 20 minutes, 3 times per day or as directed by your health care provider.  · Carefully lift the edge of the nail away from the sore skin by wedging a small piece of cotton under the corner of the nail. This may help with the pain.  Be careful not to cause more injury  to the area.  · Wear shoes that fit well. If your ingrown toenail is causing you pain, try wearing sandals, if possible.  · Trim your toenails regularly and carefully. Do not cut them in a curved shape. Cut your toenails straight across. This prevents injury to the skin at the corners of the toenail.  · Keep your feet clean and dry.  · If you are having trouble walking and are given crutches by your health care provider, use them as directed.  · Do not pick at your toenail or try to remove it yourself.  · Take medicines only as directed by your health care provider.  · Keep all follow-up visits as directed by your health care provider. This is important.  SEEK MEDICAL CARE IF:  · Your symptoms do not improve with treatment.  SEEK IMMEDIATE MEDICAL CARE IF:  · You have red streaks that start at your foot and go up your leg.  · You have a fever.  · You have increased redness, swelling, or pain.  · You have fluid, blood, or pus coming from your toenail.     This information is not intended to replace advice given to you by your health care provider. Make sure you discuss any questions you have with your health care provider.     Document Released:   11/05/2000 Document Revised: 03/25/2015 Document Reviewed: 10/02/2014  Elsevier Interactive Patient Education ©2016 Elsevier Inc.

## 2015-09-11 NOTE — ED Provider Notes (Signed)
[facility] Emergency Department Provider Note  ____________________________________________  Time seen: Approximately 7:49 AM  I have reviewed the triage vital signs and the nursing notes.   HISTORY  Chief Complaint Foot Pain  HPI Cameron Wendee CoppRayshawn Shellhammer is a 19 y.o. male to complain of right great toe pain. Patient states that 4 days ago began getting red and swollen. Today he cannot stand the pain. He also states that his left great toe is beginning to do that very same thing. Denies any injury to his toes.Patient states that he has not had any problems such as this in the past. He has not taken any over-the-counter medication for his pain. Currently he rates his pain is 4 out of 10 and is wearing foot flops to avoid any pressure on his toes.   History reviewed. No pertinent past medical history.  There are no active problems to display for this patient.   Past Surgical History  Procedure Laterality Date  . Eye surgery    . Hernia repair      Current Outpatient Rx  Name  Route  Sig  Dispense  Refill  . cephALEXin (KEFLEX) 500 MG capsule   Oral   Take 1 capsule (500 mg total) by mouth 4 (four) times daily.   28 capsule   0   . cyclobenzaprine (FLEXERIL) 10 MG tablet   Oral   Take 1 tablet (10 mg total) by mouth every 8 (eight) hours as needed for muscle spasms.   30 tablet   1   . DM-Phenylephrine-Acetaminophen (VICKS DAYQUIL MULTI-SYMPTOM) 10-5-325 MG/15ML LIQD   Oral   Take 30 mLs by mouth every 6 (six) hours as needed (cough).         Marland Kitchen. ibuprofen (ADVIL,MOTRIN) 800 MG tablet   Oral   Take 1 tablet (800 mg total) by mouth 3 (three) times daily.   30 tablet   0   . methocarbamol (ROBAXIN) 500 MG tablet   Oral   Take 2 tablets (1,000 mg total) by mouth 2 (two) times daily.   30 tablet   0     Allergies Bee venom and Mushroom ext cmplx(shiitake-reishi-mait)  Family History  Problem Relation Age of Onset  . Diabetes Mother      Social History Social History  Substance Use Topics  . Smoking status: Never Smoker   . Smokeless tobacco: Never Used  . Alcohol Use: No    Review of Systems Constitutional: No fever/chills Eyes: No visual changes. ENT: No sore throat. Cardiovascular: Denies chest pain. Respiratory: Denies shortness of breath. Gastrointestinal:  No nausea, no vomiting.   Musculoskeletal: Negative for back pain. Skin: Negative for rash. Right great toe there is moderate edema and erythema  Neurological: Negative for headaches, focal weakness or numbness.  10-point ROS otherwise negative.  ____________________________________________   PHYSICAL EXAM:  VITAL SIGNS: ED Triage Vitals  Enc Vitals Group     BP 09/11/15 0725 121/58 mmHg     Pulse Rate 09/11/15 0725 57     Resp 09/11/15 0725 18     Temp 09/11/15 0725 98.2 F (36.8 C)     Temp Source 09/11/15 0725 Oral     SpO2 09/11/15 0725 97 %     Weight 09/11/15 0725 280 lb (127.007 kg)     Height 09/11/15 0725 6' (1.829 m)     Head Cir --      Peak Flow --      Pain Score 09/11/15 0726 4  Pain Loc --      Pain Edu? --      Excl. in GC? --     Constitutional: Alert and oriented. Well appearing and in no acute distress. Eyes: Conjunctivae are normal. PERRL. EOMI. Head: Atraumatic. Nose: No congestion/rhinnorhea. Neck: No stridor.   Cardiovascular: Normal rate, regular rhythm. Grossly normal heart sounds.  Good peripheral circulation. Respiratory: Normal respiratory effort.  No retractions. Lungs CTAB. Gastrointestinal: Soft and nontender. No distention.  Musculoskeletal: No lower extremity tenderness nor edema.  No joint effusions. Neurologic:  Normal speech and language. No gross focal neurologic deficits are appreciated. No gait instability. Skin:  Skin is warm, dry and intact. No rash noted. Right great toe there is moderate edema and erythema with mild/enveloping the nail. There is no active drainage at this  time.  Psychiatric: Mood and affect are normal. Speech and behavior are normal.  ____________________________________________   LABS (all labs ordered are listed, but only abnormal results are displayed)  Labs Reviewed - No data to display  PROCEDURES  Procedure(s) performed: None  Critical Care performed: No  ____________________________________________   INITIAL IMPRESSION / ASSESSMENT AND PLAN / ED COURSE  Pertinent labs & imaging results that were available during my care of the patient were reviewed by me and considered in my medical decision making (see chart for details).  Patient was placed on Keflex 500 mg 4 times a day for 7 days along with ibuprofen as needed for pain. Patient was encouraged to do warm water soaks with his foot and follow-up with Dr.Cline. ____________________________________________   FINAL CLINICAL IMPRESSION(S) / ED DIAGNOSES  Final diagnoses:  Ingrown right big toenail      Tommi Rumps, PA-C 09/11/15 1539  Emily Filbert, MD 09/11/15 [phone]

## 2015-09-11 NOTE — ED Notes (Signed)
Having pain with swelling to right great toe for several days

## 2015-09-11 NOTE — ED Notes (Signed)
Pt to ed with c/o right foot first toe pain, redness and swelling x 4 days.

## 2015-09-16 ENCOUNTER — Emergency Department
Admission: EM | Admit: 2015-09-16 | Discharge: 2015-09-16 | Disposition: A | Discharge status: Home / Self Care | Payer: Medicaid Other | Attending: Emergency Medicine | Admitting: Emergency Medicine

## 2015-09-16 ENCOUNTER — Encounter: Payer: Self-pay | Admitting: Emergency Medicine

## 2015-09-16 DIAGNOSIS — Z792 Long term (current) use of antibiotics: Secondary | ICD-10-CM | POA: Insufficient documentation

## 2015-09-16 DIAGNOSIS — Z79899 Other long term (current) drug therapy: Secondary | ICD-10-CM | POA: Insufficient documentation

## 2015-09-16 DIAGNOSIS — L600 Ingrowing nail: Secondary | ICD-10-CM | POA: Insufficient documentation

## 2015-09-16 LAB — GLUCOSE, CAPILLARY: Glucose-Capillary: 80 mg/dL (ref 65–99)

## 2015-09-16 MED ORDER — HYDROCODONE-ACETAMINOPHEN 5-325 MG PO TABS
1.0000 | ORAL_TABLET | Freq: Once | ORAL | Status: AC
  Administered 2015-09-16: 1 via ORAL
  Filled 2015-09-16: qty 1

## 2015-09-16 MED ORDER — LIDOCAINE HCL (PF) 1 % IJ SOLN
5.0000 mL | Freq: Once | INTRAMUSCULAR | Status: AC
  Administered 2015-09-16: 5 mL
  Filled 2015-09-16: qty 5

## 2015-09-16 MED ORDER — BACITRACIN ZINC 500 UNIT/GM EX OINT
TOPICAL_OINTMENT | Freq: Every day | CUTANEOUS | Status: DC
  Administered 2015-09-16: 1 via TOPICAL
  Filled 2015-09-16: qty 0.9

## 2015-09-16 MED ORDER — HYDROCODONE-ACETAMINOPHEN 5-325 MG PO TABS: 1.0000 | ORAL_TABLET | Freq: Three times a day (TID) | ORAL | Status: DC | PRN

## 2015-09-16 NOTE — Discharge Instructions (Signed)
Fingernail or Toenail Removal, Care After °Refer to this sheet in the next few weeks. These instructions provide you with information about caring for yourself after your procedure. Your health care provider may also give you more specific instructions. Your treatment has been planned according to current medical practices, but problems sometimes occur. Call your health care provider if you have any problems or questions after your procedure. °WHAT TO EXPECT AFTER THE PROCEDURE °After your procedure, it is common to have: °· Redness. °· Swelling. °HOME CARE INSTRUCTIONS °· If you have a splint on your finger: °· Wear it as directed by your health care provider. Remove it only as directed by your health care provider. °· Loosen the splint if your fingers become numb and tingle, or if they turn cold and blue. °· If you were given a surgical shoe, wear it as directed by your health care provider. °· Take medicines only as directed by your health care provider. °· Elevate your hand or foot as much of the time as possible. This helps with pain and swelling. °· If you are recovering from fingernail removal, keep your hand raised above the level of your heart. °· If you are recovering from toenail removal, lie on a bed or a couch with your leg propped up on pillows, or sit in a reclining chair with the footrest up. °· Follow instructions from your health care provider about bandage (dressing) changes and removal: °· Change your dressing 24 hours after your procedure or as directed by your health care provider. °· Soak your hand or foot in warm, soapy water for 10-20 minutes or as directed by your health care provider. Do this 3 times per day or as directed by your health care provider. This reduces pain and swelling. °· After you soak your hand or foot, apply a clean, dry dressing. °· Keep your dressing clean and dry. Change your dressing whenever it gets wet or dirty. °· Keep all follow-up visits as directed by your  health care provider. This is important. °SEEK MEDICAL CARE IF: °· You have increased redness or pain at your nail area. °· You have increased fluid, blood, or pus coming from your nail area. °· There is a bad smell coming from the dressing. °· You have a fever. °· Your swelling gets worse, or you have swelling that spreads from your finger to your hand or from your toe to your foot. °· You have worsening redness that spreads from your finger to your hand or from your toe up to your foot. °· Your finger or toe looks blue or black. °  °This information is not intended to replace advice given to you by your health care provider. Make sure you discuss any questions you have with your health care provider. °  °Document Released: 11/29/2014 Document Reviewed: 11/29/2014 °Elsevier Interactive Patient Education ©2016 Elsevier Inc. ° °Ingrown Toenail °An ingrown toenail occurs when the corner or sides of your toenail grow into the surrounding skin. The big toe is most commonly affected, but it can happen to any of your toes. If your ingrown toenail is not treated, you will be at risk for infection. °CAUSES °This condition may be caused by: °· Wearing shoes that are too small or tight. °· Injury or trauma, such as stubbing your toe or having your toe stepped on. °· Improper cutting or care of your toenails. °· Being born with (congenital) nail or foot abnormalities, such as having a nail that is too big for   your toe. RISK FACTORS Risk factors for an ingrown toenail include:  Age. Your nails tend to thicken as you get older, so ingrown nails are more common in older people.  Diabetes.  Cutting your toenails incorrectly.  Blood circulation problems. SYMPTOMS Symptoms may include:  Pain, soreness, or tenderness.  Redness.  Swelling.  Hardening of the skin surrounding the toe. Your ingrown toenail may be infected if there is fluid, pus, or drainage. DIAGNOSIS  An ingrown toenail may be diagnosed by medical  history and physical exam. If your toenail is infected, your health care provider may test a sample of the drainage. TREATMENT Treatment depends on the severity of your ingrown toenail. Some ingrown toenails may be treated at home. More severe or infected ingrown toenails may require surgery to remove all or part of the nail. Infected ingrown toenails may also be treated with antibiotic medicines. HOME CARE INSTRUCTIONS  If you were prescribed an antibiotic medicine, finish all of it even if you start to feel better.  Soak your foot in warm soapy water for 20 minutes, 3 times per day or as directed by your health care provider.  Carefully lift the edge of the nail away from the sore skin by wedging a small piece of cotton under the corner of the nail. This may help with the pain. Be careful not to cause more injury to the area.  Wear shoes that fit well. If your ingrown toenail is causing you pain, try wearing sandals, if possible.  Trim your toenails regularly and carefully. Do not cut them in a curved shape. Cut your toenails straight across. This prevents injury to the skin at the corners of the toenail.  Keep your feet clean and dry.  If you are having trouble walking and are given crutches by your health care provider, use them as directed.  Do not pick at your toenail or try to remove it yourself.  Take medicines only as directed by your health care provider.  Keep all follow-up visits as directed by your health care provider. This is important. SEEK MEDICAL CARE IF:  Your symptoms do not improve with treatment. SEEK IMMEDIATE MEDICAL CARE IF:  You have red streaks that start at your foot and go up your leg.  You have a fever.  You have increased redness, swelling, or pain.  You have fluid, blood, or pus coming from your toenail.   This information is not intended to replace advice given to you by your health care provider. Make sure you discuss any questions you have with  your health care provider.   Document Released: 11/05/2000 Document Revised: 03/25/2015 Document Reviewed: 10/02/2014 Elsevier Interactive Patient Education [employer]2016 [employer]  Keep the wound clean, dry, and covered.  Apply antibiotic ointment to promote healing. Soak daily with epsom salt + warm water. Dry completely before applying dressing. Take the antibiotic as previously prescribed. Follow-up with Dr. Orland Jarredroxler as needed.

## 2015-09-16 NOTE — ED Provider Notes (Signed)
[facility] Emergency Department Provider Note ____________________________________________  Time seen: 2110  I have reviewed the triage vital signs and the nursing notes.  HISTORY  Chief Complaint  Toe Pain  HPI Cameron Moore is a 19 y.o. male reports to the ED for follow-up to his previously diagnosed right great toe ingrown toenail. He was initially treated here empirically with Keflex and advised to follow-up with a local podiatrist. He was unable to secure follow with the podiatrist in [place] secondary to insurance complications. He returns today noting increased pain and tenderness to the left great toe.He notes he last dosed at about 2 days prior. He also did note some spontaneous drainage of purulent material from the superficial aspect of the right great toe a few days prior. He denies any reinjury, interim trauma, or fever. He rates his pain at a 6/10 in triage.  History reviewed. No pertinent past medical history.  There are no active problems to display for this patient.   Past Surgical History  Procedure Laterality Date  . Eye surgery    . Hernia repair      Current Outpatient Rx  Name  Route  Sig  Dispense  Refill  . cephALEXin (KEFLEX) 500 MG capsule   Oral   Take 1 capsule (500 mg total) by mouth 4 (four) times daily.   28 capsule   0   . cyclobenzaprine (FLEXERIL) 10 MG tablet   Oral   Take 1 tablet (10 mg total) by mouth every 8 (eight) hours as needed for muscle spasms.   30 tablet   1   . DM-Phenylephrine-Acetaminophen (VICKS DAYQUIL MULTI-SYMPTOM) 10-5-325 MG/15ML LIQD   Oral   Take 30 mLs by mouth every 6 (six) hours as needed (cough).         Marland Kitchen. HYDROcodone-acetaminophen (NORCO) 5-325 MG tablet   Oral   Take 1 tablet by mouth every 8 (eight) hours as needed for moderate pain.   10 tablet   0   . ibuprofen (ADVIL,MOTRIN) 800 MG tablet   Oral   Take 1 tablet (800 mg total) by mouth 3 (three) times daily.  30 tablet   0   . methocarbamol (ROBAXIN) 500 MG tablet   Oral   Take 2 tablets (1,000 mg total) by mouth 2 (two) times daily.   30 tablet   0    Allergies Bee venom and Mushroom ext cmplx(shiitake-reishi-mait)  Family History  Problem Relation Age of Onset  . Diabetes Mother     Social History Social History  Substance Use Topics  . Smoking status: Never Smoker   . Smokeless tobacco: Never Used  . Alcohol Use: No   Review of Systems  Constitutional: Negative for fever. Eyes: Negative for visual changes. ENT: Negative for sore throat. Cardiovascular: Negative for chest pain. Respiratory: Negative for shortness of breath. Gastrointestinal: Negative for abdominal pain, vomiting and diarrhea. Genitourinary: Negative for dysuria. Musculoskeletal: Negative for back pain. Right toe pain as above. Skin: Negative for rash. Neurological: Negative for headaches, focal weakness or numbness. ____________________________________________  PHYSICAL EXAM:  VITAL SIGNS: ED Triage Vitals  Enc Vitals Group     BP 09/16/15 2026 135/85 mmHg     Pulse Rate 09/16/15 2026 81     Resp 09/16/15 2026 18     Temp 09/16/15 2026 98.3 F (36.8 C)     Temp Source 09/16/15 2026 Oral     SpO2 09/16/15 2026 98 %     Weight 09/16/15 2026 270  lb (122.471 kg)     Height --      Head Cir --      Peak Flow --      Pain Score 09/16/15 2027 6     Pain Loc --      Pain Edu? --      Excl. in GC? --    Constitutional: Alert and oriented. Well appearing and in no distress. Head: Normocephalic and atraumatic.      Eyes: Conjunctivae are normal. PERRL. Normal extraocular movements      Ears: Canals clear. TMs intact bilaterally.   Nose: No congestion/rhinorrhea.   Mouth/Throat: Mucous membranes are moist.   Neck: Supple. No thyromegaly. Hematological/Lymphatic/Immunological: No cervical lymphadenopathy. Cardiovascular: Normal rate, regular rhythm.  Respiratory: Normal respiratory  effort. No wheezes/rales/rhonchi. Gastrointestinal: Soft and nontender. No distention. Musculoskeletal: Nontender with normal range of motion in all extremities.  Neurologic:  Normal gait without ataxia. Normal speech and language. No gross focal neurologic deficits are appreciated. Skin:  Skin is warm, dry and intact. No rash noted. The right great toe is noted to have significant swelling to the lateral aspect of the cuticle. There is edema to the cuticle as well as indication of some superficial purulent material. Psychiatric: Mood and affect are normal. Patient exhibits appropriate insight and judgment. ____________________________________________  PROCEDURES  Norco 5-325 mg PO  ingrown toenail  PLAN: Informed consent is obtained. Using 1% plain lidocaine, a ring block was done (6 cc total). Using sterile instruments, I freed the nail from the nail bed and removed a wedge of the nail including the ingrown portion to the level of the nail skin fold. This was well tolerated, minimal bleeding. Antibiotic ointment and a dressing are applied.  ____________________________________________  INITIAL IMPRESSION / ASSESSMENT AND PLAN / ED COURSE  Right great toe ingrown nail s/p partial removal. Remove the dressing tomorrow and begin frequent soaks, complete his antibiotics and have a follow up visit in a week with [employer]. Return to the ED if pain, erythema fever or bleeding occur. Wound care and dressing instructions are given. Prescription for Norco#10 provided. ____________________________________________  FINAL CLINICAL IMPRESSION(S) / ED DIAGNOSES  Final diagnoses:  Ingrown right big toenail       Lissa Hoard, PA-C 09/17/15 0050  Jennye Moccasin, MD 09/20/15 1330

## 2015-09-16 NOTE — ED Notes (Signed)
Pt dc home ambulatory instructed on follow up plan medication use and wound care. PT NAD AT DC

## 2015-09-16 NOTE — ED Notes (Addendum)
Pt presents to ED with right great toe pain. Onset about a week ago with no known injury. Pt seen in this ED on Thursday and referred to a podiatrist for follow up. Pt was told that his insurance would not cover the cost of being seen there and was told to come back to this ED to possibly have fluid drawn off the toe. Hx of the same but states they usually resolve on their own. Pt states he is concerned that he may be diabetic because of these issues reoccurring.

## 2015-09-29 ENCOUNTER — Encounter: Payer: Self-pay | Admitting: Emergency Medicine

## 2015-09-29 DIAGNOSIS — Z791 Long term (current) use of non-steroidal anti-inflammatories (NSAID): Secondary | ICD-10-CM | POA: Insufficient documentation

## 2015-09-29 DIAGNOSIS — M542 Cervicalgia: Secondary | ICD-10-CM | POA: Insufficient documentation

## 2015-09-29 DIAGNOSIS — R42 Dizziness and giddiness: Secondary | ICD-10-CM | POA: Insufficient documentation

## 2015-09-29 DIAGNOSIS — R110 Nausea: Secondary | ICD-10-CM | POA: Insufficient documentation

## 2015-09-29 DIAGNOSIS — Z79899 Other long term (current) drug therapy: Secondary | ICD-10-CM | POA: Insufficient documentation

## 2015-09-29 DIAGNOSIS — H938X3 Other specified disorders of ear, bilateral: Secondary | ICD-10-CM | POA: Insufficient documentation

## 2015-09-29 DIAGNOSIS — Z792 Long term (current) use of antibiotics: Secondary | ICD-10-CM | POA: Insufficient documentation

## 2015-09-29 LAB — CBC
HCT: 45.2 % (ref 40.0–52.0)
HEMOGLOBIN: 15.3 g/dL (ref 13.0–18.0)
MCH: 30.9 pg (ref 26.0–34.0)
MCHC: 33.9 g/dL (ref 32.0–36.0)
MCV: 91 fL (ref 80.0–100.0)
Platelets: 256 10*3/uL (ref 150–440)
RBC: 4.97 MIL/uL (ref 4.40–5.90)
RDW: 13.2 % (ref 11.5–14.5)
WBC: 8.3 10*3/uL (ref 3.8–10.6)

## 2015-09-29 LAB — BASIC METABOLIC PANEL
ANION GAP: 2 — AB (ref 5–15)
BUN: 16 mg/dL (ref 6–20)
CALCIUM: 9.8 mg/dL (ref 8.9–10.3)
CO2: 30 mmol/L (ref 22–32)
Chloride: 107 mmol/L (ref 101–111)
Creatinine, Ser: 1.17 mg/dL (ref 0.61–1.24)
GLUCOSE: 78 mg/dL (ref 65–99)
Potassium: 4 mmol/L (ref 3.5–5.1)
Sodium: 139 mmol/L (ref 135–145)

## 2015-09-29 LAB — URINALYSIS COMPLETE WITH MICROSCOPIC (ARMC ONLY)
BILIRUBIN URINE: NEGATIVE
Bacteria, UA: NONE SEEN
GLUCOSE, UA: NEGATIVE mg/dL
Hgb urine dipstick: NEGATIVE
KETONES UR: NEGATIVE mg/dL
Leukocytes, UA: NEGATIVE
NITRITE: NEGATIVE
Protein, ur: NEGATIVE mg/dL
SPECIFIC GRAVITY, URINE: 1.028 (ref 1.005–1.030)
pH: 6 (ref 5.0–8.0)

## 2015-09-29 LAB — TROPONIN I: Troponin I: <0.03 ng/mL (ref <0.031)

## 2015-09-29 NOTE — ED Notes (Signed)
Patient ambulatory to triage with steady gait, without difficulty or distress noted; pt reports dizziness since this morning and pain to back of neck; denies hx of same

## 2015-09-30 ENCOUNTER — Emergency Department: Payer: Medicaid Other

## 2015-09-30 ENCOUNTER — Emergency Department
Admission: EM | Admit: 2015-09-30 | Discharge: 2015-09-30 | Discharge status: Against Medical Advice | Payer: Medicaid Other | Attending: Emergency Medicine | Admitting: Emergency Medicine

## 2015-09-30 DIAGNOSIS — R42 Dizziness and giddiness: Secondary | ICD-10-CM

## 2015-09-30 DIAGNOSIS — M542 Cervicalgia: Secondary | ICD-10-CM

## 2015-09-30 MED ORDER — ONDANSETRON HCL 4 MG PO TABS: 4.0000 mg | ORAL_TABLET | Freq: Three times a day (TID) | ORAL | Status: DC | PRN

## 2015-09-30 MED ORDER — MECLIZINE HCL 25 MG PO TABS: 25.0000 mg | ORAL_TABLET | Freq: Three times a day (TID) | ORAL | Status: DC | PRN

## 2015-09-30 NOTE — Discharge Instructions (Signed)
1. You may take medicines as needed for dizziness and nausea (meclizine/Zofran #20). 2. Return to the ER for worsening symptoms, persistent vomiting, difficulty breathing or other concerns.  Dizziness Dizziness is a common problem. It is a feeling of unsteadiness or light-headedness. You may feel like you are about to faint. Dizziness can lead to injury if you stumble or fall. Anyone can become dizzy, but dizziness is more common in older adults. This condition can be caused by a number of things, including medicines, dehydration, or illness. HOME CARE INSTRUCTIONS Taking these steps may help with your condition: Eating and Drinking  Drink enough fluid to keep your urine clear or pale yellow. This helps to keep you from becoming dehydrated. Try to drink more clear fluids, such as water.  Do not drink alcohol.  Limit your caffeine intake if directed by your health care provider.  Limit your salt intake if directed by your health care provider. Activity  Avoid making quick movements.  Rise slowly from chairs and steady yourself until you feel okay.  In the morning, first sit up on the side of the bed. When you feel okay, stand slowly while you hold onto something until you know that your balance is fine.  Move your legs often if you need to stand in one place for a long time. Tighten and relax your muscles in your legs while you are standing.  Do not drive or operate heavy machinery if you feel dizzy.  Avoid bending down if you feel dizzy. Place items in your home so that they are easy for you to reach without leaning over. Lifestyle  Do not use any tobacco products, including cigarettes, chewing tobacco, or electronic cigarettes. If you need help quitting, ask your health care provider.  Try to reduce your stress level, such as with yoga or meditation. Talk with your health care provider if you need help. General Instructions  Watch your dizziness for any changes.  Take medicines  only as directed by your health care provider. Talk with your health care provider if you think that your dizziness is caused by a medicine that you are taking.  Tell a friend or a family member that you are feeling dizzy. If he or she notices any changes in your behavior, have this person call your health care provider.  Keep all follow-up visits as directed by your health care provider. This is important. SEEK MEDICAL CARE IF:  Your dizziness does not go away.  Your dizziness or light-headedness gets worse.  You feel nauseous.  You have reduced hearing.  You have new symptoms.  You are unsteady on your feet or you feel like the room is spinning. SEEK IMMEDIATE MEDICAL CARE IF:  You vomit or have diarrhea and are unable to eat or drink anything.  You have problems talking, walking, swallowing, or using your arms, hands, or legs.  You feel generally weak.  You are not thinking clearly or you have trouble forming sentences. It may take a friend or family member to notice this.  You have chest pain, abdominal pain, shortness of breath, or sweating.  Your vision changes.  You notice any bleeding.  You have a headache.  You have neck pain or a stiff neck.  You have a fever.   This information is not intended to replace advice given to you by your health care provider. Make sure you discuss any questions you have with your health care provider.   Document Released: 05/04/2001 Document Revised: 03/25/2015  Document Reviewed: 11/04/2014 Elsevier Interactive Patient Education [employer] Elsevier Inc.  Vertigo Vertigo means you feel like you or your surroundings are moving when they are not. Vertigo can be dangerous if it occurs when you are at work, driving, or performing difficult activities.  CAUSES  Vertigo occurs when there is a conflict of signals sent to your brain from the visual and sensory systems in your body. There are many different causes of vertigo,  including:  Infections, especially in the inner ear.  A bad reaction to a drug or misuse of alcohol and medicines.  Withdrawal from drugs or alcohol.  Rapidly changing positions, such as lying down or rolling over in bed.  A migraine headache.  Decreased blood flow to the brain.  Increased pressure in the brain from a head injury, infection, tumor, or bleeding. SYMPTOMS  You may feel as though the world is spinning around or you are falling to the ground. Because your balance is upset, vertigo can cause nausea and vomiting. You may have involuntary eye movements (nystagmus). DIAGNOSIS  Vertigo is usually diagnosed by physical exam. If the cause of your vertigo is unknown, your caregiver may perform imaging tests, such as an MRI scan (magnetic resonance imaging). TREATMENT  Most cases of vertigo resolve on their own, without treatment. Depending on the cause, your caregiver may prescribe certain medicines. If your vertigo is related to body position issues, your caregiver may recommend movements or procedures to correct the problem. In rare cases, if your vertigo is caused by certain inner ear problems, you may need surgery. HOME CARE INSTRUCTIONS   Follow your caregiver's instructions.  Avoid driving.  Avoid operating heavy machinery.  Avoid performing any tasks that would be dangerous to you or others during a vertigo episode.  Tell your caregiver if you notice that certain medicines seem to be causing your vertigo. Some of the medicines used to treat vertigo episodes can actually make them worse in some people. SEEK IMMEDIATE MEDICAL CARE IF:   Your medicines do not relieve your vertigo or are making it worse.  You develop problems with talking, walking, weakness, or using your arms, hands, or legs.  You develop severe headaches.  Your nausea or vomiting continues or gets worse.  You develop visual changes.  A family member notices behavioral changes.  Your condition  gets worse. MAKE SURE YOU:  Understand these instructions.  Will watch your condition.  Will get help right away if you are not doing well or get worse.   This information is not intended to replace advice given to you by your health care provider. Make sure you discuss any questions you have with your health care provider.   Document Released: 08/18/2005 Document Revised: 01/31/2012 Document Reviewed: 03/03/2015 Elsevier Interactive Patient Education 2016 Elsevier Inc.  Musculoskeletal Pain Musculoskeletal pain is muscle and boney aches and pains. These pains can occur in any part of the body. Your caregiver may treat you without knowing the cause of the pain. They may treat you if blood or urine tests, X-rays, and other tests were normal.  CAUSES There is often not a definite cause or reason for these pains. These pains may be caused by a type of germ (virus). The discomfort may also come from overuse. Overuse includes working out too hard when your body is not fit. Boney aches also come from weather changes. Bone is sensitive to atmospheric pressure changes. HOME CARE INSTRUCTIONS   Ask when your test results will be ready. Make sure  you get your test results.  Only take over-the-counter or prescription medicines for pain, discomfort, or fever as directed by your caregiver. If you were given medications for your condition, do not drive, operate machinery or power tools, or sign legal documents for 24 hours. Do not drink alcohol. Do not take sleeping pills or other medications that may interfere with treatment.  Continue all activities unless the activities cause more pain. When the pain lessens, slowly resume normal activities. Gradually increase the intensity and duration of the activities or exercise.  During periods of severe pain, bed rest may be helpful. Lay or sit in any position that is comfortable.  Putting ice on the injured area.  Put ice in a bag.  Place a towel between  your skin and the bag.  Leave the ice on for 15 to 20 minutes, 3 to 4 times a day.  Follow up with your caregiver for continued problems and no reason can be found for the pain. If the pain becomes worse or does not go away, it may be necessary to repeat tests or do additional testing. Your caregiver may need to look further for a possible cause. SEEK IMMEDIATE MEDICAL CARE IF:  You have pain that is getting worse and is not relieved by medications.  You develop chest pain that is associated with shortness or breath, sweating, feeling sick to your stomach (nauseous), or throw up (vomit).  Your pain becomes localized to the abdomen.  You develop any new symptoms that seem different or that concern you. MAKE SURE YOU:   Understand these instructions.  Will watch your condition.  Will get help right away if you are not doing well or get worse.   This information is not intended to replace advice given to you by your health care provider. Make sure you discuss any questions you have with your health care provider.   Document Released: 11/08/2005 Document Revised: 01/31/2012 Document Reviewed: 07/13/2013 Elsevier Interactive Patient Education [employer]

## 2015-09-30 NOTE — ED Provider Notes (Signed)
[facility] Emergency Department Provider Note  ____________________________________________  Time seen: Approximately 1:57 AM  I have reviewed the triage vital signs and the nursing notes.   HISTORY  Chief Complaint Dizziness    HPI Cameron Moore is a 19 y.o. male who presents to the ED from home with a chief complaint of dizziness and neck pain. Patient reports gradual onset of posterior neck pain approximately 5 PM yesterday. 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 Complains of sharp, nonradiating posterior neck pain mainly when extending neck.Approximately 2 AM patient awoke with sensation of dizziness (room spinning). Symptoms associated with nausea, no vomiting. Dizziness worsened by head movement. Patient denies recent trauma or travel. Denies recent fever, chills, chest pain, shortness of breath, vision changes, abdominal pain, diarrhea, weakness, numbness, tingling.   Past medical history None   There are no active problems to display for this patient.   Past Surgical History  Procedure Laterality Date  . Eye surgery    . Hernia repair      Current Outpatient Rx  Name  Route  Sig  Dispense  Refill  . cephALEXin (KEFLEX) 500 MG capsule   Oral   Take 1 capsule (500 mg total) by mouth 4 (four) times daily.   28 capsule   0   . cyclobenzaprine (FLEXERIL) 10 MG tablet   Oral   Take 1 tablet (10 mg total) by mouth every 8 (eight) hours as needed for muscle spasms.   30 tablet   1   . DM-Phenylephrine-Acetaminophen (VICKS DAYQUIL MULTI-SYMPTOM) 10-5-325 MG/15ML LIQD   Oral   Take 30 mLs by mouth every 6 (six) hours as needed (cough).         Marland Kitchen HYDROcodone-acetaminophen (NORCO) 5-325 MG tablet   Oral   Take 1 tablet by mouth every 8 (eight) hours as needed for moderate pain.   10 tablet   0   . ibuprofen (ADVIL,MOTRIN) 800 MG tablet   Oral   Take 1 tablet (800 mg total) by mouth 3 (three) times  daily.   30 tablet   0   . methocarbamol (ROBAXIN) 500 MG tablet   Oral   Take 2 tablets (1,000 mg total) by mouth 2 (two) times daily.   30 tablet   0     Allergies Bee venom and Mushroom ext cmplx(shiitake-reishi-mait)  Family History  Problem Relation Age of Onset  . Diabetes Mother     Social History Social History  Substance Use Topics  . Smoking status: Never Smoker   . Smokeless tobacco: Never Used  . Alcohol Use: No    Review of Systems Constitutional: No fever/chills Eyes: No visual changes. ENT: Positive for neck pain. No sore throat. Cardiovascular: Denies chest pain. Respiratory: Denies shortness of breath. Gastrointestinal: No abdominal pain.  No nausea, no vomiting.  No diarrhea.  No constipation. Genitourinary: Negative for dysuria. Musculoskeletal: Negative for back pain. Skin: Negative for rash. Neurological: Positive for dizziness. Negative for headaches, focal weakness or numbness.  10-point ROS otherwise negative.  ____________________________________________   PHYSICAL EXAM:  VITAL SIGNS: ED Triage Vitals  Enc Vitals Group     BP 09/29/15 2041 125/61 mmHg     Pulse Rate 09/29/15 2041 66     Resp 09/29/15 2041 18     Temp 09/29/15 2041 98.4 F (36.9 C)     Temp Source 09/29/15 2041 Oral     SpO2 09/29/15 2041 99 %     Weight  09/29/15 2041 280 lb (127.007 kg)     Height 09/29/15 2041 6' (1.829 m)     Head Cir --      Peak Flow --      Pain Score 09/29/15 2040 4     Pain Loc --      Pain Edu? --      Excl. in GC? --     Constitutional: Asleep, easily awakened for exam. Alert and oriented. Well appearing and in no acute distress. Eyes: Conjunctivae are normal. PERRL. EOMI. Head: Atraumatic. Ears: Slight fluid behind both TMs, otherwise within normal limits. Nose: No congestion/rhinnorhea. Mouth/Throat: Mucous membranes are moist.  Oropharynx non-erythematous. Neck: No stridor.  No cervical spine tenderness to palpation.  No  carotid bruits.  Mild left paraspinal spasms. Full range of motion with slight posterior neck pain on extension. Cardiovascular: Normal rate, regular rhythm. Grossly normal heart sounds.  Good peripheral circulation. Respiratory: Normal respiratory effort.  No retractions. Lungs CTAB. Gastrointestinal: Soft and nontender. No distention. No abdominal bruits. No CVA tenderness. Musculoskeletal: No lower extremity tenderness nor edema.  No joint effusions. Neurologic:  Normal speech and language. No gross focal neurologic deficits are appreciated. No gait instability. Skin:  Skin is warm, dry and intact. No rash noted. Psychiatric: Mood and affect are normal. Speech and behavior are normal.  ____________________________________________   LABS (all labs ordered are listed, but only abnormal results are displayed)  Labs Reviewed  BASIC METABOLIC PANEL - Abnormal; Notable for the following:    Anion gap 2 (*)    All other components within normal limits  URINALYSIS COMPLETEWITH MICROSCOPIC ([facility] ONLY) - Abnormal; Notable for the following:    Color, Urine YELLOW (*)    APPearance CLEAR (*)    Squamous Epithelial / LPF 0-5 (*)    All other components within normal limits  CBC  TROPONIN I   ____________________________________________  EKG  None ____________________________________________  RADIOLOGY  Cervical spine xrays (viewed by me, interpreted per Dr. Manus GunningEhinger): Negative cervical spine radiographs. ____________________________________________   PROCEDURES  Procedure(s) performed: None  Critical Care performed: No  ____________________________________________   INITIAL IMPRESSION / ASSESSMENT AND PLAN / ED COURSE  Pertinent labs & imaging results that were available during my care of the patient were reviewed by me and considered in my medical decision making (see chart for details).  19 year old male who presents with dizziness consistent with vertigo and posterior  neck pain. Dizziness resolved while patient was taking a nap in the treatment room. Will image cervical spine. Patient voices no complaints of pain or nausea currently. No focal neurological deficits.  ----------------------------------------- 4:16 AM on 09/30/2015 -----------------------------------------  Patient sleeping. Awakened to update him of negative cervical spine results. Prescriptions for meclizine and Zofran provided. Strict return precautions given. Patient verbalizes understanding and agrees with plan of care. ____________________________________________   FINAL CLINICAL IMPRESSION(S) / ED DIAGNOSES  Final diagnoses:  Vertigo  Neck pain      Irean HongJade J Barnes Florek, MD 09/30/15 0710

## 2015-09-30 NOTE — ED Notes (Signed)
Pt placed back on waiting room list--st was in BR when called earlier

## 2015-12-14 ENCOUNTER — Emergency Department: Payer: Medicaid Other

## 2015-12-14 DIAGNOSIS — Z79899 Other long term (current) drug therapy: Secondary | ICD-10-CM | POA: Insufficient documentation

## 2015-12-14 DIAGNOSIS — N433 Hydrocele, unspecified: Secondary | ICD-10-CM | POA: Insufficient documentation

## 2015-12-14 DIAGNOSIS — Z791 Long term (current) use of non-steroidal anti-inflammatories (NSAID): Secondary | ICD-10-CM | POA: Insufficient documentation

## 2015-12-14 DIAGNOSIS — Z792 Long term (current) use of antibiotics: Secondary | ICD-10-CM | POA: Insufficient documentation

## 2015-12-14 LAB — URINALYSIS COMPLETE WITH MICROSCOPIC (ARMC ONLY)
BACTERIA UA: NONE SEEN
BILIRUBIN URINE: NEGATIVE
GLUCOSE, UA: NEGATIVE mg/dL
Hgb urine dipstick: NEGATIVE
Ketones, ur: NEGATIVE mg/dL
Leukocytes, UA: NEGATIVE
NITRITE: NEGATIVE
Protein, ur: 30 mg/dL — AB
SPECIFIC GRAVITY, URINE: 1.027 (ref 1.005–1.030)
Squamous Epithelial / LPF: NONE SEEN
pH: 5 (ref 5.0–8.0)

## 2015-12-14 NOTE — ED Notes (Signed)
Pt states that he has been having testicle pain for the past 2 days, states that the rt testicle feels smaller than normal

## 2015-12-15 ENCOUNTER — Encounter: Payer: Self-pay | Admitting: Emergency Medicine

## 2015-12-15 ENCOUNTER — Emergency Department
Admission: EM | Admit: 2015-12-15 | Discharge: 2015-12-15 | Disposition: A | Discharge status: Home / Self Care | Payer: Medicaid Other | Attending: Emergency Medicine | Admitting: Emergency Medicine

## 2015-12-15 DIAGNOSIS — N433 Hydrocele, unspecified: Secondary | ICD-10-CM

## 2015-12-15 DIAGNOSIS — N50819 Testicular pain, unspecified: Secondary | ICD-10-CM

## 2015-12-15 MED ORDER — NAPROXEN 500 MG PO TABS
500.0000 mg | ORAL_TABLET | Freq: Once | ORAL | Status: AC
  Administered 2015-12-15: 500 mg via ORAL
  Filled 2015-12-15: qty 1

## 2015-12-15 MED ORDER — NAPROXEN 500 MG PO TABS: 500.0000 mg | ORAL_TABLET | Freq: Two times a day (BID) | ORAL | Status: DC

## 2015-12-15 NOTE — ED Notes (Signed)
Patient with no complaints at this time. Respirations even and unlabored. Skin warm/dry. Discharge instructions reviewed with patient at this time. Patient given opportunity to voice concerns/ask questions. Patient discharged at this time and left Emergency Department with steady gait.   

## 2015-12-15 NOTE — Discharge Instructions (Signed)
1. Take pain medicine as needed (Naproxen #14). 2. Elevate affected areas as often as possible. 3. Return to the ER for worsening symptoms, persistent vomiting, fever or other concerns.  Hydrocele, Adult A hydrocele is a collection of fluid in the loose pouch of skin that holds the testicles (scrotum). Usually, it affects only one testicle. CAUSES This condition may be caused by:  An injury to the scrotum.  An infection.  A tumor or cancer of the testicle.  Twisting of a testicle.  Decreased blood flow to the scrotum. SYMPTOMS A hydrocele feels like a water-filled balloon. It may also feel heavy. A hydrocele can cause:  Swelling of the scrotum. The swelling may decrease when you lie down.  Swelling of the groin.  Mild discomfort in the scrotum.  Pain. This can develop if the hydrocele was caused by infection or twisting. DIAGNOSIS This condition may be diagnosed with a medical history, physical exam, and imaging tests. You may also have blood and urine tests to check for infection. TREATMENT Treatment may include:  Watching and waiting, particularly if the hydrocele causes no symptoms.  Treatment of the underlying condition. This may include using antibiotic medicine.  Surgery to drain the fluid. Some surgical options include:  Needle aspiration. For this procedure, a needle is used to drain fluid.  Hydrocelectomy. For this procedure, an incision is made in the scrotum to remove the fluid sac. HOME CARE INSTRUCTIONS  Keep all follow-up visits as told by your health care provider. This is important.  Watch the hydrocele for any changes.  Take over-the-counter and prescription medicines only as told by your health care provider.  If you were prescribed an antibiotic medicine, use it as told by your health care provider. Do not stop using the antibiotic even if your condition improves. SEEK MEDICAL CARE IF:  The swelling in your scrotum or groin gets worse.  The  hydrocele becomes red, firm, tender to the touch, or painful.  You notice any changes in the hydrocele.  You have a fever.   This information is not intended to replace advice given to you by your health care provider. Make sure you discuss any questions you have with your health care provider.   Document Released: 04/28/2010 Document Revised: 03/25/2015 Document Reviewed: 11/04/2014 Elsevier Interactive Patient Education [employer]

## 2015-12-15 NOTE — ED Provider Notes (Signed)
[facility] Emergency Department Provider Note  ____________________________________________  Time seen: Approximately 12:38 AM  I have reviewed the triage vital signs and the nursing notes.   HISTORY  Chief Complaint Testicle Pain    HPI Cameron Moore is a 20 y.o. male who presents to the ED with a chief complaint of testicle pain. Notes nontraumatic bilateral testicle pain x 2 days. States the right testicle "feels smaller than normal". Describes the sensation as "blue balls". Worse on standing and leg rubbing, better on laying back. Denies fever, chills, abdominal pain, dysuria, hematuria, penile discharge. Denies STD concerns/exposure.    Past Medical History None  There are no active problems to display for this patient.   Past Surgical History  Procedure Laterality Date  . Eye surgery    . Hernia repair      Current Outpatient Rx  Name  Route  Sig  Dispense  Refill  . cephALEXin (KEFLEX) 500 MG capsule   Oral   Take 1 capsule (500 mg total) by mouth 4 (four) times daily.   28 capsule   0   . cyclobenzaprine (FLEXERIL) 10 MG tablet   Oral   Take 1 tablet (10 mg total) by mouth every 8 (eight) hours as needed for muscle spasms.   30 tablet   1   . DM-Phenylephrine-Acetaminophen (VICKS DAYQUIL MULTI-SYMPTOM) 10-5-325 MG/15ML LIQD   Oral   Take 30 mLs by mouth every 6 (six) hours as needed (cough).         Marland Kitchen HYDROcodone-acetaminophen (NORCO) 5-325 MG tablet   Oral   Take 1 tablet by mouth every 8 (eight) hours as needed for moderate pain.   10 tablet   0   . ibuprofen (ADVIL,MOTRIN) 800 MG tablet   Oral   Take 1 tablet (800 mg total) by mouth 3 (three) times daily.   30 tablet   0   . meclizine (ANTIVERT) 25 MG tablet   Oral   Take 1 tablet (25 mg total) by mouth 3 (three) times daily as needed for dizziness or nausea.   20 tablet   0   . methocarbamol (ROBAXIN) 500 MG tablet   Oral   Take 2 tablets (1,000 mg  total) by mouth 2 (two) times daily.   30 tablet   0   . ondansetron (ZOFRAN) 4 MG tablet   Oral   Take 1 tablet (4 mg total) by mouth every 8 (eight) hours as needed for nausea or vomiting.   20 tablet   1     Allergies Bee venom and Mushroom ext cmplx(shiitake-reishi-mait)  Family History  Problem Relation Age of Onset  . Diabetes Mother     Social History Social History  Substance Use Topics  . Smoking status: Never Smoker   . Smokeless tobacco: Never Used  . Alcohol Use: No    Review of Systems Constitutional: No fever/chills Eyes: No visual changes. ENT: No sore throat. Cardiovascular: Denies chest pain. Respiratory: Denies shortness of breath. Gastrointestinal: No abdominal pain.  No nausea, no vomiting.  No diarrhea.  No constipation. Genitourinary: Positive for bilateral testicular pain. Negative for dysuria. Musculoskeletal: Negative for back pain. Skin: Negative for rash. Neurological: Negative for headaches, focal weakness or numbness.  10-point ROS otherwise negative.  ____________________________________________   PHYSICAL EXAM:  VITAL SIGNS: ED Triage Vitals  Enc Vitals Group     BP 12/14/15 2226 149/88 mmHg     Pulse Rate 12/14/15 2226 87     Resp  12/14/15 2226 18     Temp 12/14/15 2226 98.2 F (36.8 C)     Temp Source 12/14/15 2226 Oral     SpO2 12/14/15 2226 98 %     Weight 12/14/15 2226 280 lb (127.007 kg)     Height 12/14/15 2226 6' (1.829 m)     Head Cir --      Peak Flow --      Pain Score 12/14/15 2227 3     Pain Loc --      Pain Edu? --      Excl. in GC? --     Constitutional: Alert and oriented. Well appearing and in no acute distress. Eyes: Conjunctivae are normal. PERRL. EOMI. Head: Atraumatic. Nose: No congestion/rhinnorhea. Mouth/Throat: Mucous membranes are moist.  Oropharynx non-erythematous. Neck: No stridor.   Cardiovascular: Normal rate, regular rhythm. Grossly normal heart sounds.  Good peripheral  circulation. Respiratory: Normal respiratory effort.  No retractions. Lungs CTAB. Gastrointestinal: Soft and nontender. No distention. No abdominal bruits. No CVA tenderness. Genitourinary: Circumsized male. No penile discharge or rash/lesions/vesicles. Bilaterally descended testicles which are mildly tender to palpation. No swelling noted. Strong cremaster reflexes bilaterally. No palpable inguinal masses.  Musculoskeletal: No lower extremity tenderness nor edema.  No joint effusions. Neurologic:  Normal speech and language. No gross focal neurologic deficits are appreciated. No gait instability. Skin:  Skin is warm, dry and intact. No rash noted. Psychiatric: Mood and affect are normal. Speech and behavior are normal.  ____________________________________________   LABS (all labs ordered are listed, but only abnormal results are displayed)  Labs Reviewed  URINALYSIS COMPLETEWITH MICROSCOPIC ([facility] ONLY) - Abnormal; Notable for the following:    Color, Urine YELLOW (*)    APPearance CLEAR (*)    Protein, ur 30 (*)    All other components within normal limits   ____________________________________________  EKG  None ____________________________________________  RADIOLOGY  Scrotal US: 1. No evidence of testicular torsion. 2. Mild bilateral testicular microlithiasis noted. ____________________________________________   PROCEDURES  Procedure(s) performed: None  Critical Care performed: No  ____________________________________________   INITIAL IMPRESSION / ASSESSMENT AND PLAN / ED COURSE  Pertinent labs & imaging results that were available during my care of the patient were reviewed by me and considered in my medical decision making (see chart for details).  20 year old male who presents with nontraumatic testicular pain. Updated patient of urine and ultrasound results. Will treat with NSAIDs, scrotal elevation, follow up urology. Strict return precautions given.  Patient verbalizes understanding and agrees with plan of care. ____________________________________________   FINAL CLINICAL IMPRESSION(S) / ED DIAGNOSES  Final diagnoses:  Testicular pain      Irean Hong, MD 12/15/15 [phone]

## 2016-03-16 ENCOUNTER — Emergency Department: Payer: Self-pay

## 2016-03-16 ENCOUNTER — Emergency Department
Admission: EM | Admit: 2016-03-16 | Discharge: 2016-03-16 | Disposition: A | Discharge status: Home / Self Care | Payer: Self-pay | Attending: Emergency Medicine | Admitting: Emergency Medicine

## 2016-03-16 ENCOUNTER — Encounter: Payer: Self-pay | Admitting: *Deleted

## 2016-03-16 DIAGNOSIS — Z791 Long term (current) use of non-steroidal anti-inflammatories (NSAID): Secondary | ICD-10-CM | POA: Insufficient documentation

## 2016-03-16 DIAGNOSIS — Y92410 Unspecified street and highway as the place of occurrence of the external cause: Secondary | ICD-10-CM | POA: Insufficient documentation

## 2016-03-16 DIAGNOSIS — Z79899 Other long term (current) drug therapy: Secondary | ICD-10-CM | POA: Insufficient documentation

## 2016-03-16 DIAGNOSIS — Z792 Long term (current) use of antibiotics: Secondary | ICD-10-CM | POA: Insufficient documentation

## 2016-03-16 DIAGNOSIS — Y939 Activity, unspecified: Secondary | ICD-10-CM | POA: Insufficient documentation

## 2016-03-16 DIAGNOSIS — S161XXA Strain of muscle, fascia and tendon at neck level, initial encounter: Secondary | ICD-10-CM | POA: Insufficient documentation

## 2016-03-16 DIAGNOSIS — Y999 Unspecified external cause status: Secondary | ICD-10-CM | POA: Insufficient documentation

## 2016-03-16 MED ORDER — KETOROLAC TROMETHAMINE 30 MG/ML IJ SOLN
30.0000 mg | Freq: Once | INTRAMUSCULAR | Status: AC
  Administered 2016-03-16: 30 mg via INTRAMUSCULAR
  Filled 2016-03-16: qty 1

## 2016-03-16 MED ORDER — MELOXICAM 15 MG PO TABS: 15.0000 mg | ORAL_TABLET | Freq: Every day | ORAL | Status: DC

## 2016-03-16 MED ORDER — BACLOFEN 10 MG PO TABS: 10.0000 mg | ORAL_TABLET | Freq: Three times a day (TID) | ORAL | Status: DC

## 2016-03-16 NOTE — Discharge Instructions (Signed)
Cryotherapy °Cryotherapy means treatment with cold. Ice or gel packs can be used to reduce both pain and swelling. Ice is the most helpful within the first 24 to 48 hours after an injury or flare-up from overusing a muscle or joint. Sprains, strains, spasms, burning pain, shooting pain, and aches can all be eased with ice. Ice can also be used when recovering from surgery. Ice is effective, has very few side effects, and is safe for most people to use. °PRECAUTIONS  °Ice is not a safe treatment option for people with: °· Raynaud phenomenon. This is a condition affecting small blood vessels in the extremities. Exposure to cold may cause your problems to return. °· Cold hypersensitivity. There are many forms of cold hypersensitivity, including: °¨ Cold urticaria. Red, itchy hives appear on the skin when the tissues begin to warm after being iced. °¨ Cold erythema. This is a red, itchy rash caused by exposure to cold. °¨ Cold hemoglobinuria. Red blood cells break down when the tissues begin to warm after being iced. The hemoglobin that carry oxygen are passed into the urine because they cannot combine with blood proteins fast enough. °· Numbness or altered sensitivity in the area being iced. °If you have any of the following conditions, do not use ice until you have discussed cryotherapy with your caregiver: °· Heart conditions, such as arrhythmia, angina, or chronic heart disease. °· High blood pressure. °· Healing wounds or open skin in the area being iced. °· Current infections. °· Rheumatoid arthritis. °· Poor circulation. °· Diabetes. °Ice slows the blood flow in the region it is applied. This is beneficial when trying to stop inflamed tissues from spreading irritating chemicals to surrounding tissues. However, if you expose your skin to cold temperatures for too long or without the proper protection, you can damage your skin or nerves. Watch for signs of skin damage due to cold. °HOME CARE INSTRUCTIONS °Follow  these tips to use ice and cold packs safely. °· Place a dry or damp towel between the ice and skin. A damp towel will cool the skin more quickly, so you may need to shorten the time that the ice is used. °· For a more rapid response, add gentle compression to the ice. °· Ice for no more than 10 to 20 minutes at a time. The bonier the area you are icing, the less time it will take to get the benefits of ice. °· Check your skin after 5 minutes to make sure there are no signs of a poor response to cold or skin damage. °· Rest 20 minutes or more between uses. °· Once your skin is numb, you can end your treatment. You can test numbness by very lightly touching your skin. The touch should be so light that you do not see the skin dimple from the pressure of your fingertip. When using ice, most people will feel these normal sensations in this order: cold, burning, aching, and numbness. °· Do not use ice on someone who cannot communicate their responses to pain, such as small children or people with dementia. °HOW TO MAKE AN ICE PACK °Ice packs are the most common way to use ice therapy. Other methods include ice massage, ice baths, and cryosprays. Muscle creams that cause a cold, tingly feeling do not offer the same benefits that ice offers and should not be used as a substitute unless recommended by your caregiver. °To make an ice pack, do one of the following: °· Place crushed ice or a   bag of frozen vegetables in a sealable plastic bag. Squeeze out the excess air. Place this bag inside another plastic bag. Slide the bag into a pillowcase or place a damp towel between your skin and the bag.  Mix 3 parts water with 1 part rubbing alcohol. Freeze the mixture in a sealable plastic bag. When you remove the mixture from the freezer, it will be slushy. Squeeze out the excess air. Place this bag inside another plastic bag. Slide the bag into a pillowcase or place a damp towel between your skin and the bag. SEEK MEDICAL CARE  IF:  You develop white spots on your skin. This may give the skin a blotchy (mottled) appearance.  Your skin turns blue or pale.  Your skin becomes waxy or hard.  Your swelling gets worse. MAKE SURE YOU:   Understand these instructions.  Will watch your condition.  Will get help right away if you are not doing well or get worse.   This information is not intended to replace advice given to you by your health care provider. Make sure you discuss any questions you have with your health care provider.   Document Released: 07/05/2011 Document Revised: 11/29/2014 Document Reviewed: 07/05/2011 Elsevier Interactive Patient Education 2016 [employer]  Cervical Sprain A cervical sprain is when the tissues (ligaments) that hold the neck bones in place stretch or tear. HOME CARE   Put ice on the injured area.  Put ice in a plastic bag.  Place a towel between your skin and the bag.  Leave the ice on for 15-20 minutes, 3-4 times a day.  You may have been given a collar to wear. This collar keeps your neck from moving while you heal.  Do not take the collar off unless told by your doctor.  If you have long hair, keep it outside of the collar.  Ask your doctor before changing the position of your collar. You may need to change its position over time to make it more comfortable.  If you are allowed to take off the collar for cleaning or bathing, follow your doctor's instructions on how to do it safely.  Keep your collar clean by wiping it with mild soap and water. Dry it completely. If the collar has removable pads, remove them every 1-2 days to hand wash them with soap and water. Allow them to air dry. They should be dry before you wear them in the collar.  Do not drive while wearing the collar.  Only take medicine as told by your doctor.  Keep all doctor visits as told.  Keep all physical therapy visits as told.  Adjust your work station so that you have good posture while  you work.  Avoid positions and activities that make your problems worse.  Warm up and stretch before being active. GET HELP IF:  Your pain is not controlled with medicine.  You cannot take less pain medicine over time as planned.  Your activity level does not improve as expected. GET HELP RIGHT AWAY IF:   You are bleeding.  Your stomach is upset.  You have an allergic reaction to your medicine.  You develop new problems that you cannot explain.  You lose feeling (become numb) or you cannot move any part of your body (paralysis).  You have tingling or weakness in any part of your body.  Your symptoms get worse. Symptoms include:  Pain, soreness, stiffness, puffiness (swelling), or a burning feeling in your neck.  Pain when your  neck is touched.  Shoulder or upper back pain.  Limited ability to move your neck.  Headache.  Dizziness.  Your hands or arms feel week, lose feeling, or tingle.  Muscle spasms.  Difficulty swallowing or chewing. MAKE SURE YOU:   Understand these instructions.  Will watch your condition.  Will get help right away if you are not doing well or get worse.   This information is not intended to replace advice given to you by your health care provider. Make sure you discuss any questions you have with your health care provider.   Document Released: 04/26/2008 Document Revised: 07/11/2013 Document Reviewed: 05/16/2013 Elsevier Interactive Patient Education [employer]
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
## 2016-03-16 NOTE — ED Notes (Signed)
States neck pain going down to his shoulder blades for 3 days

## 2016-07-22 ENCOUNTER — Encounter: Payer: Self-pay | Admitting: Emergency Medicine

## 2016-07-22 ENCOUNTER — Emergency Department
Admission: EM | Admit: 2016-07-22 | Discharge: 2016-07-23 | Disposition: A | Discharge status: Home / Self Care | Payer: No Typology Code available for payment source | Attending: Emergency Medicine | Admitting: Emergency Medicine

## 2016-07-22 ENCOUNTER — Emergency Department: Payer: No Typology Code available for payment source

## 2016-07-22 DIAGNOSIS — S93401A Sprain of unspecified ligament of right ankle, initial encounter: Secondary | ICD-10-CM | POA: Diagnosis not present

## 2016-07-22 DIAGNOSIS — Y9389 Activity, other specified: Secondary | ICD-10-CM | POA: Diagnosis not present

## 2016-07-22 DIAGNOSIS — Y92410 Unspecified street and highway as the place of occurrence of the external cause: Secondary | ICD-10-CM | POA: Diagnosis not present

## 2016-07-22 DIAGNOSIS — S8391XA Sprain of unspecified site of right knee, initial encounter: Secondary | ICD-10-CM | POA: Diagnosis not present

## 2016-07-22 DIAGNOSIS — Y999 Unspecified external cause status: Secondary | ICD-10-CM | POA: Insufficient documentation

## 2016-07-22 DIAGNOSIS — S8991XA Unspecified injury of right lower leg, initial encounter: Secondary | ICD-10-CM | POA: Diagnosis present

## 2016-07-22 MED ORDER — HYDROCODONE-ACETAMINOPHEN 5-325 MG PO TABS: 1.0000 | ORAL_TABLET | ORAL | 0 refills | Status: DC | PRN

## 2016-07-22 MED ORDER — CYCLOBENZAPRINE HCL 10 MG PO TABS: 10.0000 mg | ORAL_TABLET | Freq: Three times a day (TID) | ORAL | 0 refills | Status: DC | PRN

## 2016-07-22 MED ORDER — IBUPROFEN 800 MG PO TABS: 800.0000 mg | ORAL_TABLET | Freq: Three times a day (TID) | ORAL | 0 refills | Status: DC | PRN

## 2016-07-22 MED ORDER — IBUPROFEN 800 MG PO TABS
800.0000 mg | ORAL_TABLET | Freq: Once | ORAL | Status: AC
  Administered 2016-07-22: 800 mg via ORAL
  Filled 2016-07-22: qty 1

## 2016-07-22 MED ORDER — HYDROCODONE-ACETAMINOPHEN 5-325 MG PO TABS
1.0000 | ORAL_TABLET | Freq: Once | ORAL | Status: AC
  Administered 2016-07-22: 1 via ORAL
  Filled 2016-07-22: qty 1
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
## 2016-07-22 NOTE — Discharge Instructions (Signed)
Take pain medicine as directed. Continue ice and use an Ace wrap for support. If not improving next week should contact orthopedics for follow-up evaluation.

## 2016-07-23 NOTE — ED Notes (Signed)
Discharge instructions reviewed with patient. Questions fielded by this RN. Patient verbalizes understanding of instructions. Patient discharged home in stable condition per Tumey PA. No acute distress noted at time of discharge.   

## 2016-07-24 ENCOUNTER — Emergency Department: Payer: Medicaid Other

## 2016-07-24 ENCOUNTER — Encounter: Payer: Self-pay | Admitting: Emergency Medicine

## 2016-07-24 ENCOUNTER — Emergency Department
Admission: EM | Admit: 2016-07-24 | Discharge: 2016-07-24 | Disposition: A | Discharge status: Home / Self Care | Payer: Medicaid Other | Attending: Emergency Medicine | Admitting: Emergency Medicine

## 2016-07-24 DIAGNOSIS — Y999 Unspecified external cause status: Secondary | ICD-10-CM | POA: Insufficient documentation

## 2016-07-24 DIAGNOSIS — Y9389 Activity, other specified: Secondary | ICD-10-CM | POA: Insufficient documentation

## 2016-07-24 DIAGNOSIS — M79641 Pain in right hand: Secondary | ICD-10-CM

## 2016-07-24 DIAGNOSIS — W1839XA Other fall on same level, initial encounter: Secondary | ICD-10-CM | POA: Insufficient documentation

## 2016-07-24 DIAGNOSIS — Y9289 Other specified places as the place of occurrence of the external cause: Secondary | ICD-10-CM | POA: Insufficient documentation

## 2016-07-24 DIAGNOSIS — S60221A Contusion of right hand, initial encounter: Secondary | ICD-10-CM | POA: Insufficient documentation

## 2016-07-24 MED ORDER — IBUPROFEN 800 MG PO TABS
800.0000 mg | ORAL_TABLET | Freq: Once | ORAL | Status: AC
  Administered 2016-07-24: 800 mg via ORAL
  Filled 2016-07-24: qty 1

## 2016-07-24 NOTE — Discharge Instructions (Signed)
1. Keep splint clean and dry. Apply ice to affected area over splint several times daily. 2. Fill your hydrocodone prescription that you received previously in the ED. He may take that for more severe pain. Otherwise, he may take ibuprofen as needed for discomfort. 3. Return to the ER for worsening symptoms, increased swelling, numbness/tingling, or other concerns.
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
## 2016-12-15 IMAGING — CR DG HAND COMPLETE 3+V*R*
1 series · 3 of 3 positions shown · non-contrast
Comparison: None.

CLINICAL DATA: Initial evaluation for acute trauma, pain at third
through fifth digits with swelling.

EXAM:
RIGHT HAND - COMPLETE 3+ VIEW

[Series 1: dg hand complete right · 0.14mm/px · 3 of 3 slices shown]
[im 1/3]
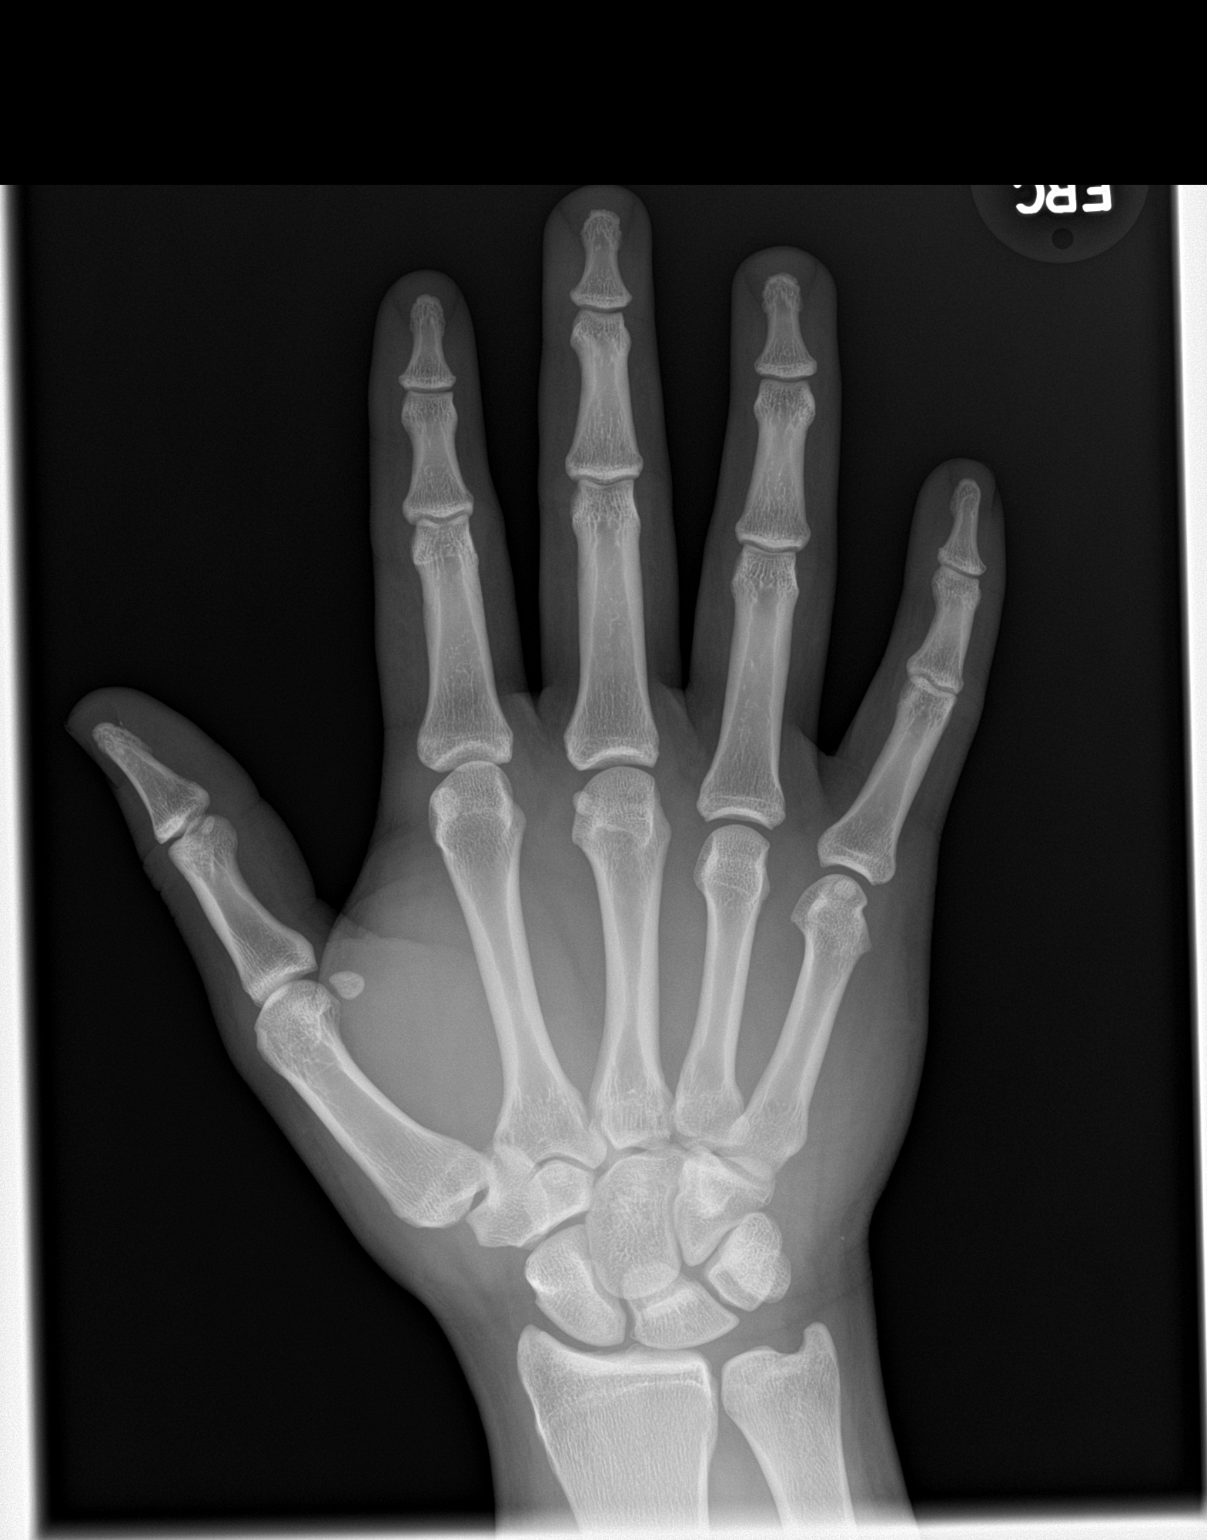
[im 2/3]
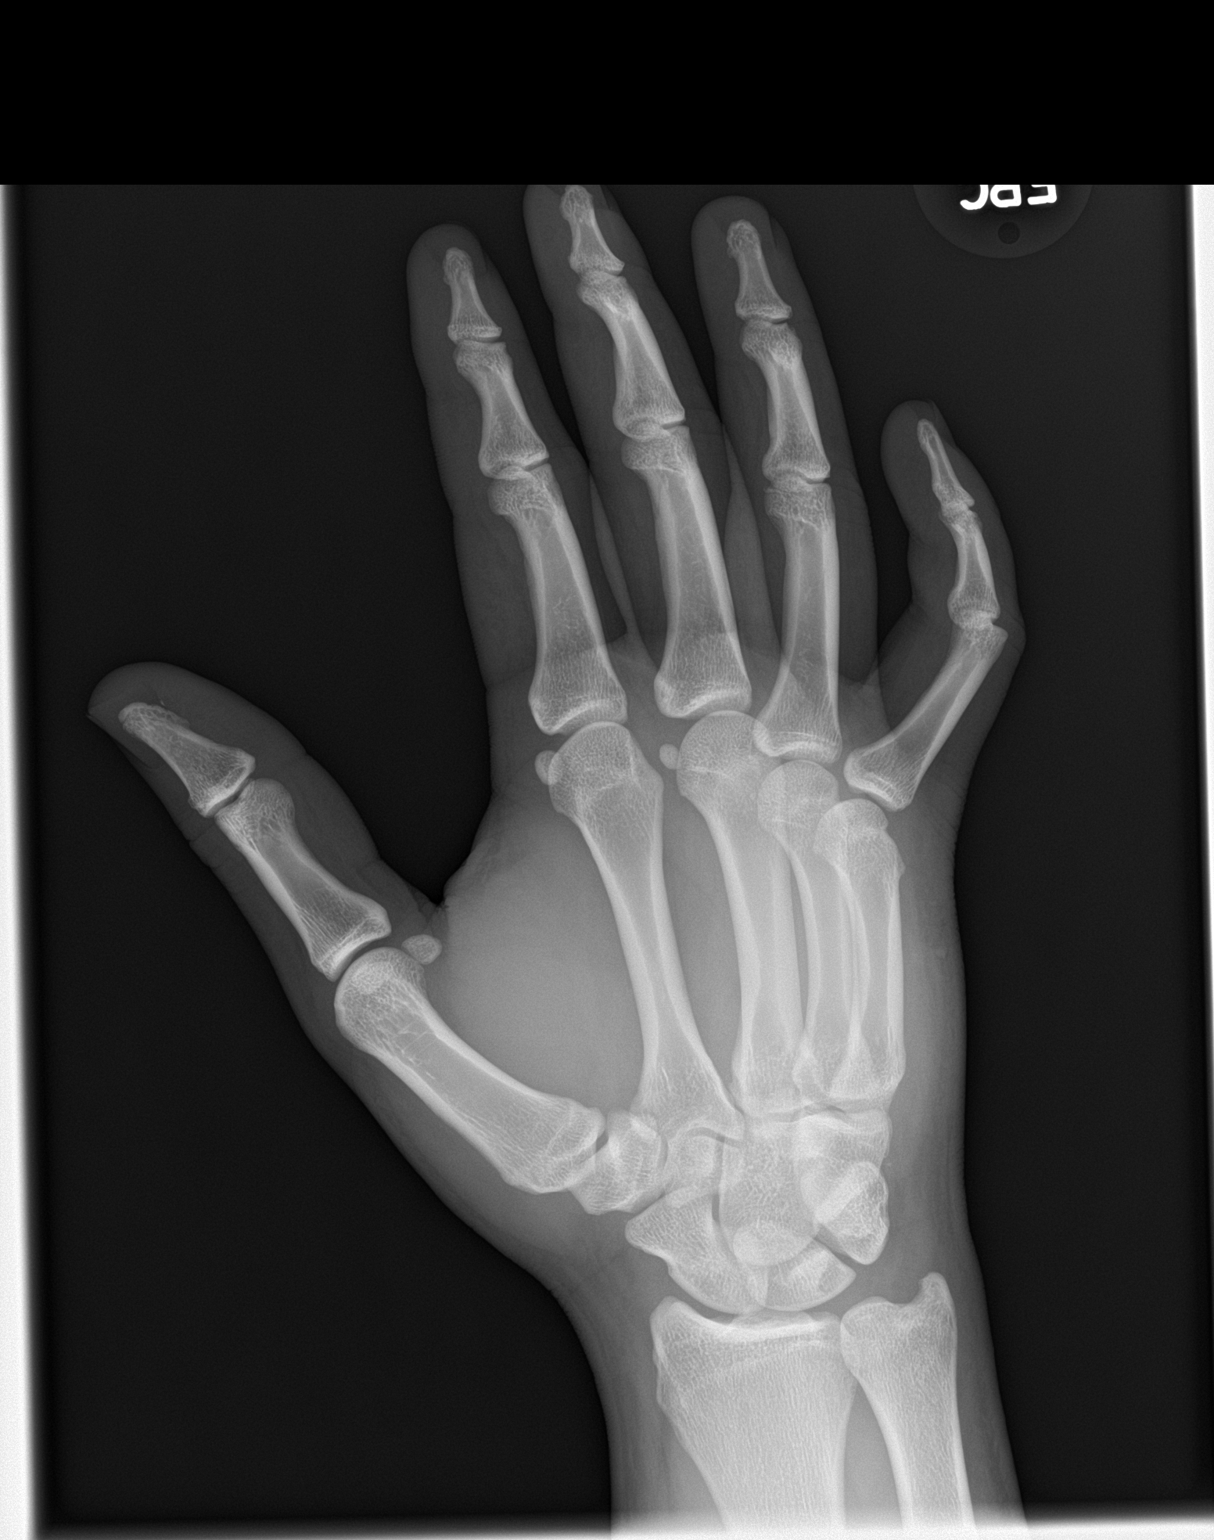
[im 3/3]
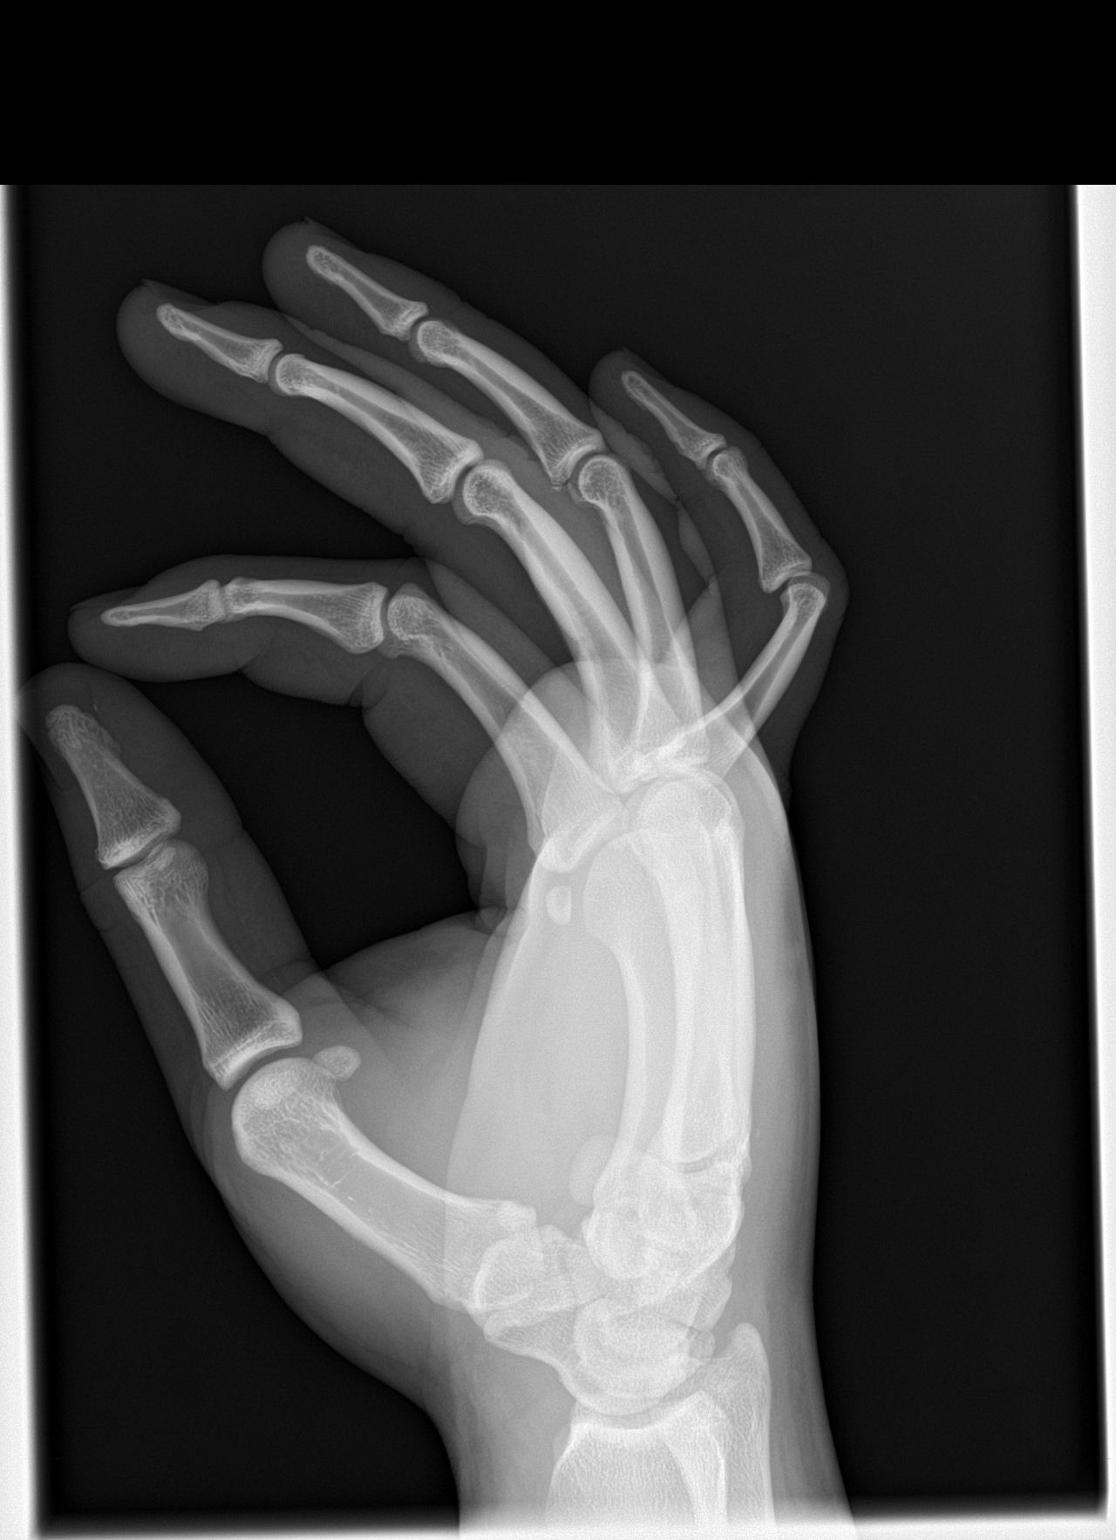

[3 of 3 positions shown; findings below may reference images not displayed]

FINDINGS: The tiny osseous density at the base of the right fourth middle
phalanx on lateral projection. Finding favored to be chronic in
nature, although a possible small bold shin injury not entirely
excluded. No other acute fracture dislocation. Joint spaces well
maintained without evidence for significant degenerative or erosive
arthropathy. Osseous mineralization normal. No appreciable soft
tissue swelling identified.
IMPRESSION: 1. Tiny osseous fragment at the base of the right fourth middle
phalanx. While this is favored to be chronic in nature, a possible
small avulsion injury is not excluded. Correlation with physical
exam for pain at this location recommended.
2. No other acute abnormality about the right hand.

## 2017-07-02 ENCOUNTER — Encounter (HOSPITAL_BASED_OUTPATIENT_CLINIC_OR_DEPARTMENT_OTHER): Payer: Self-pay | Admitting: *Deleted

## 2017-07-02 ENCOUNTER — Emergency Department (HOSPITAL_BASED_OUTPATIENT_CLINIC_OR_DEPARTMENT_OTHER)
Admission: EM | Admit: 2017-07-02 | Discharge: 2017-07-02 | Disposition: A | Discharge status: Home / Self Care | Payer: Medicaid Other | Attending: Emergency Medicine | Admitting: Emergency Medicine

## 2017-07-02 DIAGNOSIS — R300 Dysuria: Secondary | ICD-10-CM | POA: Insufficient documentation

## 2017-07-02 DIAGNOSIS — R369 Urethral discharge, unspecified: Secondary | ICD-10-CM | POA: Insufficient documentation

## 2017-07-02 LAB — URINALYSIS, ROUTINE W REFLEX MICROSCOPIC
Bilirubin Urine: NEGATIVE
Glucose, UA: NEGATIVE mg/dL
Hgb urine dipstick: NEGATIVE
Ketones, ur: NEGATIVE mg/dL
Nitrite: NEGATIVE
Protein, ur: NEGATIVE mg/dL
Specific Gravity, Urine: 1.027 (ref 1.005–1.030)
pH: 6.5 (ref 5.0–8.0)

## 2017-07-02 LAB — URINALYSIS, MICROSCOPIC (REFLEX): RBC / HPF: NONE SEEN RBC/hpf (ref 0–5)

## 2017-07-02 MED ORDER — AZITHROMYCIN 250 MG PO TABS
1000.0000 mg | ORAL_TABLET | Freq: Once | ORAL | Status: AC
  Administered 2017-07-02: 1000 mg via ORAL
  Filled 2017-07-02: qty 4

## 2017-07-02 MED ORDER — LIDOCAINE HCL 1 % IJ SOLN
INTRAMUSCULAR | Status: AC
  Administered 2017-07-02: 10 mL
  Filled 2017-07-02: qty 10

## 2017-07-02 MED ORDER — CEFTRIAXONE SODIUM 250 MG IJ SOLR
250.0000 mg | Freq: Once | INTRAMUSCULAR | Status: AC
  Administered 2017-07-02: 250 mg via INTRAMUSCULAR
  Filled 2017-07-02: qty 250

## 2017-07-02 NOTE — ED Provider Notes (Signed)
[facility] Provider Note   CSN: [id number] Arrival date & time: 07/02/17  1257     History   Chief Complaint Chief Complaint  Patient presents with  . Penile Discharge    HPI Cameron Moore is a 21 y.o. male who presents with a four-day history of penile discharge, penile pain, and dysuria. Patient reports intermittent burning pain to the shaft of his penis. He denies any testicular pain or swelling. Patient has had a little thick, yellow discharge. He does have concern for possible STD exposure. Patient reports he also had a small amount of blood at the end of his ejaculation the other day prior to onset of symptoms. He denies any fevers chest pain, shortness of breath, abdominal pain, nausea, vomiting.  HPI  History reviewed. No pertinent past medical history.  There are no active problems to display for this patient.   Past Surgical History:  Procedure Laterality Date  . EYE SURGERY    . HERNIA REPAIR         Home Medications    Prior to Admission medications   Medication Sig Start Date End Date Taking? Authorizing Provider  cyclobenzaprine (FLEXERIL) 10 MG tablet Take 1 tablet (10 mg total) by mouth 3 (three) times daily as needed for muscle spasms. 07/22/16   Ignacia Bayley, PA-C  HYDROcodone-acetaminophen (NORCO) 5-325 MG tablet Take 1 tablet by mouth every 4 (four) hours as needed for moderate pain. 07/22/16   Ignacia Bayley, PA-C  ibuprofen (ADVIL,MOTRIN) 800 MG tablet Take 1 tablet (800 mg total) by mouth every 8 (eight) hours as needed. 07/22/16   Ignacia Bayley, PA-C    Family History Family History  Problem Relation Age of Onset  . Diabetes Mother     Social History Social History  Substance Use Topics  . Smoking status: Never Smoker  . Smokeless tobacco: Never Used  . Alcohol use No     Allergies   Bee venom and Mushroom ext cmplx(shiitake-reishi-mait)   Review of Systems Review of Systems  Constitutional: Negative for chills  and fever.  HENT: Negative for facial swelling and sore throat.   Respiratory: Negative for shortness of breath.   Cardiovascular: Negative for chest pain.  Gastrointestinal: Negative for abdominal pain, nausea and vomiting.  Genitourinary: Positive for discharge, dysuria and penile pain. Negative for penile swelling, scrotal swelling and testicular pain.  Musculoskeletal: Negative for back pain.  Skin: Negative for rash and wound.  Neurological: Negative for headaches.  Psychiatric/Behavioral: The patient is not nervous/anxious.      Physical Exam Updated Vital Signs BP (!) 143/85 (BP Location: Right Arm)   Pulse (!) 58   Temp 98.1 F (36.7 C) (Oral)   Resp 18   Ht 6' (1.829 m)   Wt 122.3 kg (269 lb 11.2 oz)   SpO2 100%   BMI 36.58 kg/m   Physical Exam  Constitutional: He appears well-developed and well-nourished. No distress.  HENT:  Head: Normocephalic and atraumatic.  Mouth/Throat: Oropharynx is clear and moist. No oropharyngeal exudate.  Eyes: Pupils are equal, round, and reactive to light. Conjunctivae are normal. Right eye exhibits no discharge. Left eye exhibits no discharge. No scleral icterus.  Neck: Normal range of motion. Neck supple. No thyromegaly present.  Cardiovascular: Regular rhythm, normal heart sounds and intact distal pulses.  Exam reveals no gallop and no friction rub.   No murmur heard. Pulmonary/Chest: Effort normal and breath sounds normal. No stridor. No respiratory distress. He has no wheezes. He has no  rales.  Abdominal: Soft. Bowel sounds are normal. He exhibits no distension. There is no tenderness. There is no rebound and no guarding.  Genitourinary: Penis normal. Circumcised. No discharge found.  Musculoskeletal: He exhibits no edema.  Lymphadenopathy:    He has no cervical adenopathy.  Neurological: He is alert. Coordination normal.  Skin: Skin is warm and dry. No rash noted. He is not diaphoretic. No pallor.  Psychiatric: He has a normal  mood and affect.  Nursing note and vitals reviewed.    ED Treatments / Results  Labs (all labs ordered are listed, but only abnormal results are displayed) Labs Reviewed  URINALYSIS, ROUTINE W REFLEX MICROSCOPIC - Abnormal; Notable for the following:       Result Value   Leukocytes, UA MODERATE (*)    All other components within normal limits  URINALYSIS, MICROSCOPIC (REFLEX) - Abnormal; Notable for the following:    Bacteria, UA FEW (*)    Squamous Epithelial / LPF 0-5 (*)    All other components within normal limits  URINE CULTURE  GC/CHLAMYDIA PROBE AMP ([place]    EKG  EKG Interpretation None       Radiology No results found.  Procedures Procedures (including critical care time)  Medications Ordered in ED Medications  cefTRIAXone (ROCEPHIN) injection 250 mg (250 mg Intramuscular Given 07/02/17 1523)  azithromycin (ZITHROMAX) tablet 1,000 mg (1,000 mg Oral Given 07/02/17 1522)  lidocaine (XYLOCAINE) 1 % (with pres) injection (10 mLs  Given 07/02/17 1524)     Initial Impression / Assessment and Plan / ED Course  I have reviewed the triage vital signs and the nursing notes.  Pertinent labs & imaging results that were available during my care of the patient were reviewed by me and considered in my medical decision making (see chart for details).     Patient treated in the ED for STI with Rocephin and azithromycin. Patient advised to inform and treat all sexual partners.  Pt advised on safe sex practices and understands that they have GC/Chlamydia cultures pending and will result in 2-3 days. HIV and RPR sent. UA shows moderate leukocytes, few bacteria. Suspect STI, however urine culture sent. Pt encouraged to follow up at local health department for future STI checks. No concern for prostatitis or epididymitis. Discussed return precautions. Patient understands and agrees with plan. Patient vitals stable throughout ED course and discharged in satisfactory  condition.  Final Clinical Impressions(s) / ED Diagnoses   Final diagnoses:  Dysuria  Penile discharge    New Prescriptions Discharge Medication List as of 07/02/2017  4:03 PM       Emi HolesLaw, Tuwanna Krausz M, PA-C 07/02/17 1636    Shaune PollackIsaacs, Cameron, MD 07/03/17 [phone]

## 2017-07-02 NOTE — Discharge Instructions (Signed)
You will be called in 2-3 days if any of your results return positive. Please make all of your partners aware that they will need to be treated as well. Do not have intercourse within 7 days of treatment and 7 days after your partners have been treatment. We have treated you ahead of time for STIs, however this is not confirmed. We also sent a culture of your urine as well as your discharge to test for a urinary tract infection as well as gonorrhea and chlamydia. Please return to emergency department develop any new or worsening symptoms

## 2017-07-02 NOTE — ED Triage Notes (Signed)
Pt reports dysuria x2days. Reports mild penile drainage. Reports possible exposure to STDs. Denies fever, n/v.

## 2017-07-02 NOTE — ED Notes (Signed)
Wants to be checked for STD.  Partner positive

## 2017-07-02 NOTE — ED Notes (Signed)
PA at bedside. witness present for penile exam and specimen collection. Pt ambulatory to BR unassisted, in NAD. C/o discomfort.

## 2017-07-04 ENCOUNTER — Emergency Department (HOSPITAL_BASED_OUTPATIENT_CLINIC_OR_DEPARTMENT_OTHER)
Admission: EM | Admit: 2017-07-04 | Discharge: 2017-07-04 | Disposition: A | Discharge status: Home / Self Care | Payer: Medicaid Other | Attending: Emergency Medicine | Admitting: Emergency Medicine

## 2017-07-04 ENCOUNTER — Encounter (HOSPITAL_BASED_OUTPATIENT_CLINIC_OR_DEPARTMENT_OTHER): Payer: Self-pay | Admitting: *Deleted

## 2017-07-04 DIAGNOSIS — R42 Dizziness and giddiness: Secondary | ICD-10-CM | POA: Insufficient documentation

## 2017-07-04 DIAGNOSIS — R0789 Other chest pain: Secondary | ICD-10-CM

## 2017-07-04 LAB — URINE CULTURE: Culture: NO GROWTH

## 2017-07-04 LAB — CBG MONITORING, ED: GLUCOSE-CAPILLARY: 93 mg/dL (ref 65–99)

## 2017-07-04 LAB — GC/CHLAMYDIA PROBE AMP (~~LOC~~) NOT AT ARMC
Chlamydia: NEGATIVE
NEISSERIA GONORRHEA: NEGATIVE

## 2017-07-04 MED ORDER — FAMOTIDINE 20 MG PO TABS: 20.0000 mg | ORAL_TABLET | Freq: Two times a day (BID) | ORAL | 0 refills | Status: DC

## 2017-07-04 NOTE — ED Provider Notes (Signed)
[facility]   CSN: [id number] Arrival date & time: 07/04/17  7829     History   Chief Complaint Chief Complaint  Patient presents with  . Chest Pain    HPI Cameron Moore is a 21 y.o. male.  HPI Patient reports that he has been getting brief, sharp chest pains throughout his chest off and on overnight. He reports he is also felt somewhat lightheaded when he goes from sitting to standing. He does identify that as the symptoms he's had for quite a while. The patient reports that whenever he has a symptoms, he is usually dehydrated and he believes he is dehydrated now. He reports this because in the past, when he's been checked for similar type of things he's been told that he is dehydrated. He reports that he drinks quite a bit of soda and not much water and he suspects that's responsible. Patient has not been experiencing exertional chest pain. No syncope. Patient reports work is not significant physically stressful and he does not work in a hot environment. No lower extremity swelling or calf pain.  Family history: Mother has diabetes. No known cardiac disease. Patient reports his father has congestive heart failure unknown etiology from the patient. Patient has 2 older siblings and one younger sibling who are healthy. Patient reports he has one cousin with a heart murmur but who is never needed interventional treatment. No one that he can think of with sudden death or early onset cardiac disease. Social history: Patient denies tobacco use, denies any drug use. History reviewed. No pertinent past medical history.  There are no active problems to display for this patient.   Past Surgical History:  Procedure Laterality Date  . EYE SURGERY    . HERNIA REPAIR         Home Medications    Prior to Admission medications   Medication Sig Start Date End Date Taking? Authorizing Provider  famotidine (PEPCID) 20 MG tablet Take 1 tablet (20 mg total) by  mouth 2 (two) times daily. 07/04/17   Arby Barrette, MD    Family History Family History  Problem Relation Age of Onset  . Diabetes Mother     Social History Social History  Substance Use Topics  . Smoking status: Never Smoker  . Smokeless tobacco: Never Used  . Alcohol use No     Allergies   Bee venom and Mushroom ext cmplx(shiitake-reishi-mait)   Review of Systems Review of Systems 10 Systems reviewed and are negative for acute change except as noted in the HPI.  Physical Exam Updated Vital Signs BP 128/65   Pulse 67   Temp 98.2 F (36.8 C)   Resp 16   Ht 6' (1.829 m)   Wt 127 kg (280 lb)   SpO2 100%   BMI 37.97 kg/m   Physical Exam  Constitutional: He is oriented to person, place, and time.  Patient is alert and nontoxic. Clinically well and appearance. Mental status is clear. No respiratory distress. Moderate obesity.  HENT:  Head: Normocephalic and atraumatic.  Nose: Nose normal.  Mouth/Throat: Oropharynx is clear and moist.  Eyes: Pupils are equal, round, and reactive to light. EOM are normal.  Neck: Neck supple. No thyromegaly present.  Cardiovascular: Normal rate, regular rhythm, normal heart sounds and intact distal pulses.   Pulmonary/Chest: Effort normal and breath sounds normal.  Abdominal: Soft. He exhibits no distension. There is no tenderness. There is no guarding.  Musculoskeletal: Normal range of motion. He  exhibits no edema or tenderness.  Lymphadenopathy:    He has no cervical adenopathy.  Neurological: He is alert and oriented to person, place, and time. No cranial nerve deficit. He exhibits normal muscle tone. Coordination normal.  Skin: Skin is warm and dry.  Psychiatric: He has a normal mood and affect.     ED Treatments / Results  Labs (all labs ordered are listed, but only abnormal results are displayed) Labs Reviewed  CBG MONITORING, ED    EKG  EKG Interpretation  Date/Time:  Monday July 04 2017 08:42:17  EDT Ventricular Rate:  63 PR Interval:    QRS Duration: 98 QT Interval:  400 QTC Calculation: 410 R Axis:   76 Text Interpretation:  Sinus rhythm V3 V3 QRS axis change c/w previous. Confirmed by Arby BarrettePfeiffer, Jessicamarie Amiri [phone]([id number]) on 07/04/2017 8:55:44 AM       Radiology No results found.  Procedures Procedures (including critical care time)  Medications Ordered in ED Medications - No data to display   Initial Impression / Assessment and Plan / ED Course  I have reviewed the triage vital signs and the nursing notes.  Pertinent labs & imaging results that were available during my care of the patient were reviewed by me and considered in my medical decision making (see chart for details).      Final Clinical Impressions(s) / ED Diagnoses   Final diagnoses:  Atypical chest pain  Lightheadedness   Patient has several different symptoms. One concern, was readdressing results of his STD testing from day before yesterday. At this time, results are not complete. Patient reports however he is taking his antibiotics as prescribed and is no longer having symptoms. He reports periodic sharp random chest pains. At this time, this is atypical and does not sound suspicious for ischemic disease. Patient does not have risk factors for ischemic chest pain. I have low suspicion for no signs of DVT or other risk factors. Patient's family history is positive for diabetes and congestive heart failure but no sudden death or early coronary artery disease. At this time, difficult patient is stable for continued outpatient management. We will try an empiric course of Pepcid and patient is counseled on follow-up.Patient will seek a primary care provider. New Prescriptions New Prescriptions   FAMOTIDINE (PEPCID) 20 MG TABLET    Take 1 tablet (20 mg total) by mouth 2 (two) times daily.     Arby BarrettePfeiffer, Brendyn Mclaren, MD 07/04/17 1025

## 2017-07-04 NOTE — ED Triage Notes (Signed)
Pt c/o mid sternal cp/ SOb  through out night while working.

## 2017-10-24 ENCOUNTER — Emergency Department (HOSPITAL_BASED_OUTPATIENT_CLINIC_OR_DEPARTMENT_OTHER)
Admission: EM | Admit: 2017-10-24 | Discharge: 2017-10-24 | Disposition: A | Discharge status: Home / Self Care | Payer: Self-pay | Attending: Emergency Medicine | Admitting: Emergency Medicine

## 2017-10-24 ENCOUNTER — Encounter (HOSPITAL_BASED_OUTPATIENT_CLINIC_OR_DEPARTMENT_OTHER): Payer: Self-pay | Admitting: Emergency Medicine

## 2017-10-24 ENCOUNTER — Other Ambulatory Visit: Payer: Self-pay

## 2017-10-24 DIAGNOSIS — R1013 Epigastric pain: Secondary | ICD-10-CM | POA: Insufficient documentation

## 2017-10-24 DIAGNOSIS — Z79899 Other long term (current) drug therapy: Secondary | ICD-10-CM | POA: Insufficient documentation

## 2017-10-24 LAB — CBC WITH DIFFERENTIAL/PLATELET
BASOS ABS: 0 10*3/uL (ref 0.0–0.1)
Basophils Relative: 0 %
EOS PCT: 2 %
Eosinophils Absolute: 0.2 10*3/uL (ref 0.0–0.7)
HCT: 42.4 % (ref 39.0–52.0)
HEMOGLOBIN: 14.3 g/dL (ref 13.0–17.0)
LYMPHS PCT: 34 %
Lymphs Abs: 2.7 10*3/uL (ref 0.7–4.0)
MCH: 30.9 pg (ref 26.0–34.0)
MCHC: 33.7 g/dL (ref 30.0–36.0)
MCV: 91.6 fL (ref 78.0–100.0)
Monocytes Absolute: 1 10*3/uL (ref 0.1–1.0)
Monocytes Relative: 12 %
NEUTROS ABS: 4.1 10*3/uL (ref 1.7–7.7)
NEUTROS PCT: 52 %
PLATELETS: 238 10*3/uL (ref 150–400)
RBC: 4.63 MIL/uL (ref 4.22–5.81)
RDW: 12.2 % (ref 11.5–15.5)
WBC: 8 10*3/uL (ref 4.0–10.5)

## 2017-10-24 LAB — COMPREHENSIVE METABOLIC PANEL
ALK PHOS: 56 U/L (ref 38–126)
ALT: 56 U/L (ref 17–63)
AST: 29 U/L (ref 15–41)
Albumin: 4 g/dL (ref 3.5–5.0)
Anion gap: 5 (ref 5–15)
BUN: 21 mg/dL — AB (ref 6–20)
CHLORIDE: 105 mmol/L (ref 101–111)
CO2: 27 mmol/L (ref 22–32)
CREATININE: 1.33 mg/dL — AB (ref 0.61–1.24)
Calcium: 9.3 mg/dL (ref 8.9–10.3)
GFR calc Af Amer: >60 mL/min (ref >60)
Glucose, Bld: 92 mg/dL (ref 65–99)
Potassium: 3.9 mmol/L (ref 3.5–5.1)
SODIUM: 137 mmol/L (ref 135–145)
Total Bilirubin: 0.8 mg/dL (ref 0.3–1.2)
Total Protein: 7.2 g/dL (ref 6.5–8.1)

## 2017-10-24 LAB — LIPASE, BLOOD: Lipase: 29 U/L (ref 11–51)

## 2017-10-24 MED ORDER — OMEPRAZOLE 20 MG PO CPDR: 20.0000 mg | DELAYED_RELEASE_CAPSULE | Freq: Every day | ORAL | 1 refills | Status: DC

## 2017-10-24 MED ORDER — ALUM & MAG HYDROXIDE-SIMETH 200-200-20 MG/5ML PO SUSP: 30.0000 mL | Freq: Four times a day (QID) | ORAL | 0 refills | Status: DC | PRN

## 2017-10-24 MED ORDER — GI COCKTAIL ~~LOC~~
30.0000 mL | Freq: Once | ORAL | Status: AC
  Administered 2017-10-24: 30 mL via ORAL
  Filled 2017-10-24: qty 30

## 2017-10-24 NOTE — ED Triage Notes (Signed)
Pt reports intermittent epigastric pain x 2-3 days. Denies other sx.

## 2017-10-24 NOTE — ED Provider Notes (Signed)
[facility] EMERGENCY DEPARTMENT Provider Note   CSN: [id number] Arrival date & time: 10/24/17  [id number]     History   Chief Complaint Chief Complaint  Patient presents with  . Abdominal Pain    HPI Cameron Moore is a 21 y.o. male.  The history is provided by the patient.  Abdominal Pain   This is a new problem. The current episode started more than 2 days ago. The problem occurs daily. The problem has not changed since onset.The pain is associated with an unknown factor. The pain is located in the epigastric region. The pain is moderate. Pertinent negatives include fever, diarrhea, melena, nausea, vomiting, constipation, dysuria, frequency and hematuria. The symptoms are aggravated by certain positions. Nothing relieves the symptoms.   21 year old male who presents with epigastric abdominal pain, intermittent for the past 2-3 days.  Sharp in nature, lasting 10-15 seconds, before self resolving.  Sometimes also worse with lifting and moving.  Denies that it is associated with eating.  Denies fever, chills, nausea or vomiting, diarrhea or constipation, melena or hematochezia, recent NSAID usage, urinary complaints, chest pain or difficulty breathing. History of hernia repair.   History reviewed. No pertinent past medical history.  There are no active problems to display for this patient.   Past Surgical History:  Procedure Laterality Date  . EYE SURGERY    . HERNIA REPAIR         Home Medications    Prior to Admission medications   Medication Sig Start Date End Date Taking? Authorizing Provider  alum & mag hydroxide-simeth (MAALOX/MYLANTA) 200-200-20 MG/5ML suspension Take 30 mLs by mouth every 6 (six) hours as needed for indigestion or heartburn. 10/24/17   Lavera GuiseLiu, Izzabella Besse Duo, MD  famotidine (PEPCID) 20 MG tablet Take 1 tablet (20 mg total) by mouth 2 (two) times daily. 07/04/17   Arby BarrettePfeiffer, Marcy, MD  omeprazole (PRILOSEC) 20 MG capsule Take 1 capsule (20 mg total)  by mouth daily. 10/24/17   Lavera GuiseLiu, Shanese Riemenschneider Duo, MD    Family History Family History  Problem Relation Age of Onset  . Diabetes Mother     Social History Social History   Tobacco Use  . Smoking status: Never Smoker  . Smokeless tobacco: Never Used  Substance Use Topics  . Alcohol use: No  . Drug use: No     Allergies   Bee venom and Mushroom ext cmplx(shiitake-reishi-mait)   Review of Systems Review of Systems  Constitutional: Negative for fever.  Gastrointestinal: Positive for abdominal pain. Negative for constipation, diarrhea, melena, nausea and vomiting.  Genitourinary: Negative for dysuria, frequency and hematuria.  All other systems reviewed and are negative.    Physical Exam Updated Vital Signs BP 114/70 (BP Location: Right Arm)   Pulse 71   Temp 98.3 F (36.8 C) (Oral)   Resp 19   Ht 6' (1.829 m)   Wt 136.1 kg (300 lb)   SpO2 100%   BMI 40.69 kg/m   Physical Exam Physical Exam  Nursing note and vitals reviewed. Constitutional: Well developed, well nourished, non-toxic, and in no acute distress Head: Normocephalic and atraumatic.  Mouth/Throat: Oropharynx is clear and moist.  Neck: Normal range of motion. Neck supple.  Cardiovascular: Normal rate and regular rhythm.   Pulmonary/Chest: Effort normal and breath sounds normal.  Abdominal: Soft. There is epigastric tenderness. There is no rebound and no guarding.  Musculoskeletal: Normal range of motion.  Neurological: Alert, no facial droop, fluent speech, moves all extremities symmetrically Skin: Skin is  warm and dry.  Psychiatric: Cooperative   ED Treatments / Results  Labs (all labs ordered are listed, but only abnormal results are displayed) Labs Reviewed  COMPREHENSIVE METABOLIC PANEL - Abnormal; Notable for the following components:      Result Value   BUN 21 (*)    Creatinine, Ser 1.33 (*)    All other components within normal limits  CBC WITH DIFFERENTIAL/PLATELET  LIPASE, BLOOD    EKG   EKG Interpretation None       Radiology No results found.  Procedures Procedures (including critical care time)  Medications Ordered in ED Medications  gi cocktail (Maalox,Lidocaine,Donnatal) (30 mLs Oral Given 10/24/17 0723)     Initial Impression / Assessment and Plan / ED Course  I have reviewed the triage vital signs and the nursing notes.  Pertinent labs & imaging results that were available during my care of the patient were reviewed by me and considered in my medical decision making (see chart for details).     Presenting with intermittent epigastric abdominal pain for the past 2-3 days.  He is very well-appearing with normal vital signs.  Has a soft nonsurgical abdomen.  Pain primarily in the epigastrium.  No significant Murphy sign to suggest had a biliary processes.  No tenderness at McBurney's point, or concerns for appendicitis.  Doubt serious intra-abdominal processes at this time.  Suspect possible gastritis versus peptic ulcer disease.  He is given a GI cocktail, with significant improvement in his symptoms.  Will discharge with prescription for omeprazole.  Resources for primary care doctor provided.  Strict return and follow-up instructions reviewed. She expressed understanding of all discharge instructions and felt comfortable with the plan of care.   Final Clinical Impressions(s) / ED Diagnoses   Final diagnoses:  Epigastric abdominal pain    ED Discharge Orders        Ordered    alum & mag hydroxide-simeth (MAALOX/MYLANTA) 200-200-20 MG/5ML suspension  Every 6 hours PRN     10/24/17 0813    omeprazole (PRILOSEC) 20 MG capsule  Daily     10/24/17 0813       Lavera GuiseLiu, Alanna Storti Duo, MD 10/24/17 [phone]

## 2017-10-24 NOTE — Discharge Instructions (Signed)
You likely have inflammation of the stomach or ulcer. Continue to take mylanta and omeprazole as prescribed Please establish primary care doctor for ongoing treatment.   Return without fail for worsening symptoms, including fever, escalating pain, bloody stools or black stools, intractable vomiting or any other symptoms concerning ot you.

## 2017-11-04 ENCOUNTER — Emergency Department (HOSPITAL_BASED_OUTPATIENT_CLINIC_OR_DEPARTMENT_OTHER)
Admission: EM | Admit: 2017-11-04 | Discharge: 2017-11-04 | Disposition: A | Discharge status: Home / Self Care | Payer: Self-pay | Attending: Emergency Medicine | Admitting: Emergency Medicine

## 2017-11-04 ENCOUNTER — Encounter (HOSPITAL_BASED_OUTPATIENT_CLINIC_OR_DEPARTMENT_OTHER): Payer: Self-pay | Admitting: Emergency Medicine

## 2017-11-04 ENCOUNTER — Emergency Department (HOSPITAL_BASED_OUTPATIENT_CLINIC_OR_DEPARTMENT_OTHER): Payer: Self-pay

## 2017-11-04 ENCOUNTER — Other Ambulatory Visit: Payer: Self-pay

## 2017-11-04 DIAGNOSIS — Z79899 Other long term (current) drug therapy: Secondary | ICD-10-CM | POA: Insufficient documentation

## 2017-11-04 DIAGNOSIS — G8929 Other chronic pain: Secondary | ICD-10-CM | POA: Insufficient documentation

## 2017-11-04 DIAGNOSIS — M25562 Pain in left knee: Secondary | ICD-10-CM | POA: Insufficient documentation

## 2017-11-04 NOTE — ED Triage Notes (Signed)
Recurrent L knee pain for several months, denies injury, hurts worse in the morning.

## 2017-11-04 NOTE — ED Provider Notes (Signed)
[facility] EMERGENCY DEPARTMENT Provider Note   CSN: [id number] Arrival date & time: 11/04/17  [id number]     History   Chief Complaint Chief Complaint  Patient presents with  . Knee Pain    HPI Cameron Moore is a 21 y.o. male.  HPI Patient presents to the emergency room for evaluation of left knee pain.  Patient states it has been bothering him for months.  He decided he wanted to come in to get it checked out.  He has not seen anyone for this before.  Patient feels an aching, grinding pain in his left knee.  He notices it with certain positions.  It is also worse in the morning.  It feels like it is about to lock up when he moves his knee in certain positions.  He denies any recent falls or injuries.  He denies any fevers or chills. History reviewed. No pertinent past medical history.  There are no active problems to display for this patient.   Past Surgical History:  Procedure Laterality Date  . EYE SURGERY    . HERNIA REPAIR         Home Medications    Prior to Admission medications   Medication Sig Start Date End Date Taking? Authorizing Provider  alum & mag hydroxide-simeth (MAALOX/MYLANTA) 200-200-20 MG/5ML suspension Take 30 mLs by mouth every 6 (six) hours as needed for indigestion or heartburn. 10/24/17   Lavera GuiseLiu, Dana Duo, MD  famotidine (PEPCID) 20 MG tablet Take 1 tablet (20 mg total) by mouth 2 (two) times daily. 07/04/17   Arby BarrettePfeiffer, Marcy, MD  omeprazole (PRILOSEC) 20 MG capsule Take 1 capsule (20 mg total) by mouth daily. 10/24/17   Lavera GuiseLiu, Dana Duo, MD    Family History Family History  Problem Relation Age of Onset  . Diabetes Mother     Social History Social History   Tobacco Use  . Smoking status: Never Smoker  . Smokeless tobacco: Never Used  Substance Use Topics  . Alcohol use: No  . Drug use: No     Allergies   Bee venom and Mushroom ext cmplx(shiitake-reishi-mait)   Review of Systems Review of Systems  All other systems  reviewed and are negative.    Physical Exam Updated Vital Signs BP 107/67 (BP Location: Left Arm)   Pulse 78   Temp 98.5 F (36.9 C) (Oral)   Resp 18   Ht 1.829 m (6')   Wt 136.1 kg (300 lb)   SpO2 99%   BMI 40.69 kg/m   Physical Exam  Constitutional: He appears well-developed and well-nourished. No distress.  HENT:  Head: Normocephalic and atraumatic.  Right Ear: External ear normal.  Left Ear: External ear normal.  Eyes: Conjunctivae are normal. Right eye exhibits no discharge. Left eye exhibits no discharge. No scleral icterus.  Neck: Neck supple. No tracheal deviation present.  Cardiovascular: Normal rate.  Pulmonary/Chest: Effort normal. No stridor. No respiratory distress.  Abdominal: He exhibits no distension.  Musculoskeletal: He exhibits no edema.       Left knee: He exhibits no swelling, no effusion, no ecchymosis, no deformity, no laceration, normal alignment, no LCL laxity, normal patellar mobility, no bony tenderness and no MCL laxity.  Neurological: He is alert. Cranial nerve deficit: no gross deficits.  Skin: Skin is warm and dry. No rash noted.  Psychiatric: He has a normal mood and affect.  Nursing note and vitals reviewed.    ED Treatments / Results    Radiology Dg Knee  Complete 4 Views Left  Result Date: 11/04/2017 CLINICAL DATA:  Medial knee pain for year. EXAM: LEFT KNEE - COMPLETE 4+ VIEW COMPARISON:  05/13/2013 FINDINGS: No evidence of fracture, dislocation, or joint effusion. No evidence of arthropathy or other focal bone abnormality. Soft tissues are unremarkable. IMPRESSION: Stable normal radiographs. Electronically Signed   By: Marnee SpringJonathon  Watts M.D.   On: 11/04/2017 08:10    Procedures Procedures (including critical care time)  Medications Ordered in ED Medications - No data to display   Initial Impression / Assessment and Plan / ED Course  I have reviewed the triage vital signs and the nursing notes.  Pertinent labs & imaging results  that were available during my care of the patient were reviewed by me and considered in my medical decision making (see chart for details).   Sx suggest possible meniscal injury.  No effusion noted on exam.  No sign of infection.  X-rays are unremarkable.  Discussed over-the-counter medications as needed for pain.  Follow-up with sports medicine orthopedics for further treatment and evaluation  Final Clinical Impressions(s) / ED Diagnoses   Final diagnoses:  Chronic pain of left knee    ED Discharge Orders    None       Linwood DibblesKnapp, Delena Casebeer, MD 11/04/17 [phone]

## 2017-11-04 NOTE — Discharge Instructions (Signed)
Take over-the-counter medications such as ibuprofen or Tylenol as needed for pain.  Follow-up with an orthopedic or sports medicine doctor as we discussed

## 2019-06-17 ENCOUNTER — Other Ambulatory Visit: Payer: Self-pay

## 2019-06-17 ENCOUNTER — Encounter (HOSPITAL_BASED_OUTPATIENT_CLINIC_OR_DEPARTMENT_OTHER): Payer: Self-pay | Admitting: *Deleted

## 2019-06-17 ENCOUNTER — Emergency Department (HOSPITAL_BASED_OUTPATIENT_CLINIC_OR_DEPARTMENT_OTHER)
Admission: EM | Admit: 2019-06-17 | Discharge: 2019-06-18 | Disposition: A | Discharge status: Home / Self Care | Payer: Self-pay | Attending: Emergency Medicine | Admitting: Emergency Medicine

## 2019-06-17 DIAGNOSIS — R42 Dizziness and giddiness: Secondary | ICD-10-CM | POA: Insufficient documentation

## 2019-06-17 NOTE — ED Triage Notes (Signed)
PT reports that he thinks he is dehydrated due to becoming dizzy when turning his head too fast. Denies vomiting. Reports that he has not been drinking as much water as he should be.

## 2019-06-18 MED ORDER — MECLIZINE HCL 25 MG PO TABS
25.0000 mg | ORAL_TABLET | Freq: Once | ORAL | Status: AC
  Administered 2019-06-18: 25 mg via ORAL
  Filled 2019-06-18: qty 1

## 2019-06-18 MED ORDER — ONDANSETRON 4 MG PO TBDP
4.0000 mg | ORAL_TABLET | Freq: Once | ORAL | Status: AC
  Administered 2019-06-18: 4 mg via ORAL
  Filled 2019-06-18: qty 1

## 2019-06-18 MED ORDER — MECLIZINE HCL 25 MG PO TABS: 25.0000 mg | ORAL_TABLET | Freq: Three times a day (TID) | ORAL | 0 refills | Status: DC | PRN

## 2019-06-18 NOTE — ED Notes (Addendum)
Walked pt around nurses station per MD request.  States he feels better, but still has mild dizziness. Pt states that he needs to leave due to a ride situation. MD updated.

## 2019-06-18 NOTE — ED Notes (Signed)
Walked pt to discharge window. Denies feeling unsteady when ambulating.

## 2019-06-18 NOTE — ED Provider Notes (Signed)
[place] EMERGENCY DEPARTMENT Provider Note   CSN: [id number] Arrival date & time: 06/17/19  2326     History   Chief Complaint Chief Complaint  Patient presents with  . Dehydration    HPI Cameron Moore is a 23 y.o. male.     HPI  This is a 23 year old male who presents with dizziness.  Patient feels that he is dehydrated.  Patient reports dizziness mostly with standing but also reports some room spinning dizziness with eye movements and head movements.  He has had this before.  He has had symptoms for 2 to 3 days.  Denies fevers.  Denies any recent upper respiratory symptoms.  He has not taken anything for his symptoms.  He reports nausea without vomiting.  I have reviewed the patient's chart.  No documented visit for dehydration but patient has been diagnosed previously with vertigo and was treated with meclizine.  History reviewed. No pertinent past medical history.  There are no active problems to display for this patient.   Past Surgical History:  Procedure Laterality Date  . EYE SURGERY    . HERNIA REPAIR          Home Medications    Prior to Admission medications   Medication Sig Start Date End Date Taking? Authorizing Provider  alum & mag hydroxide-simeth (MAALOX/MYLANTA) 200-200-20 MG/5ML suspension Take 30 mLs by mouth every 6 (six) hours as needed for indigestion or heartburn. 10/24/17   Forde Dandy, MD  famotidine (PEPCID) 20 MG tablet Take 1 tablet (20 mg total) by mouth 2 (two) times daily. 07/04/17   Charlesetta Shanks, MD  meclizine (ANTIVERT) 25 MG tablet Take 1 tablet (25 mg total) by mouth 3 (three) times daily as needed for dizziness. 06/18/19   , Barbette Hair, MD  omeprazole (PRILOSEC) 20 MG capsule Take 1 capsule (20 mg total) by mouth daily. 10/24/17   Forde Dandy, MD    Family History Family History  Problem Relation Age of Onset  . Diabetes Mother     Social History Social History   Tobacco Use  . Smoking  status: Never Smoker  . Smokeless tobacco: Never Used  Substance Use Topics  . Alcohol use: No  . Drug use: No     Allergies   Bee venom and Mushroom ext cmplx(shiitake-reishi-mait)   Review of Systems Review of Systems  Constitutional: Negative for fever.  Eyes: Positive for photophobia and visual disturbance.  Respiratory: Negative for shortness of breath.   Cardiovascular: Negative for chest pain.  Gastrointestinal: Positive for nausea. Negative for abdominal pain and vomiting.  Neurological: Positive for dizziness. Negative for headaches.  All other systems reviewed and are negative.    Physical Exam Updated Vital Signs BP (!) 142/83 (BP Location: Right Arm)   Pulse 84   Temp 98.7 F (37.1 C) (Other (Comment))   Resp 18   Ht 1.829 m (6')   Wt 136.1 kg   SpO2 100%   BMI 40.69 kg/m   Physical Exam Vitals signs and nursing note reviewed.  Constitutional:      Appearance: He is well-developed.  HENT:     Head: Normocephalic and atraumatic.     Nose: Nose normal.     Mouth/Throat:     Mouth: Mucous membranes are moist.  Eyes:     Pupils: Pupils are equal, round, and reactive to light.     Comments: Extraocular movements intact, horizontal nystagmus noted  Neck:     Musculoskeletal: Neck supple.  Cardiovascular:     Rate and Rhythm: Normal rate and regular rhythm.     Heart sounds: Normal heart sounds. No murmur.  Pulmonary:     Effort: Pulmonary effort is normal. No respiratory distress.     Breath sounds: Normal breath sounds. No wheezing.  Abdominal:     General: Bowel sounds are normal.     Palpations: Abdomen is soft.     Tenderness: There is no abdominal tenderness. There is no rebound.  Musculoskeletal:     Right lower leg: No edema.     Left lower leg: No edema.  Lymphadenopathy:     Cervical: No cervical adenopathy.  Skin:    General: Skin is warm and dry.  Neurological:     Mental Status: He is alert and oriented to person, place, and time.      Comments: Cranial nerves II through XII intact, 5 out of 5 strength in all 4 extremities, no dysmetria to finger-nose-finger, normal gait  Psychiatric:        Mood and Affect: Mood normal.      ED Treatments / Results  Labs (all labs ordered are listed, but only abnormal results are displayed) Labs Reviewed - No data to display  EKG None  Radiology No results found.  Procedures Procedures (including critical care time)  Medications Ordered in ED Medications  meclizine (ANTIVERT) tablet 25 mg (25 mg Oral Given 06/18/19 0020)  ondansetron (ZOFRAN-ODT) disintegrating tablet 4 mg (4 mg Oral Given 06/18/19 0020)     Initial Impression / Assessment and Plan / ED Course  I have reviewed the triage vital signs and the nursing notes.  Pertinent labs & imaging results that were available during my care of the patient were reviewed by me and considered in my medical decision making (see chart for details).  Clinical Course as of Jun 17 108  Mon Jun 18, 2019  0109 Patient reports some improvement.  Some persistent dizziness with looking to the right.  He is able to ambulate and generally feels improved.  He is requesting discharge.  Will send home with meclizine for probable vertigo.   [CH]    Clinical Course User Index [CH] , Mayer Maskerourtney F, MD       Patient presents with symptoms of dizziness.  Overall nontoxic and vital signs are reassuring.  He appears hydrated on exam.  He maintains his blood pressure with standing on orthostatics.  Heart rate does increase from 64-81.  However, not technically orthostatic.  Symptoms are most suggestive of benign positional vertigo.  Patient given meclizine and Zofran.  He has no signs or symptoms of central cause.  On recheck, see above.  Patient did have some improvement and is requesting discharge.  We will send him with a course of meclizine.  After history, exam, and medical workup I feel the patient has been appropriately medically  screened and is safe for discharge home. Pertinent diagnoses were discussed with the patient. Patient was given return precautions.   Final Clinical Impressions(s) / ED Diagnoses   Final diagnoses:  Vertigo    ED Discharge Orders         Ordered    meclizine (ANTIVERT) 25 MG tablet  3 times daily PRN     06/18/19 0109           Shon Baton,  F, MD 06/18/19 0116

## 2019-06-18 NOTE — ED Notes (Signed)
Requesting IV access and IV meds, states "It stops me from moving for a few minutes but it makes me feel better."

## 2021-08-30 ENCOUNTER — Encounter (HOSPITAL_BASED_OUTPATIENT_CLINIC_OR_DEPARTMENT_OTHER): Payer: Self-pay | Admitting: Emergency Medicine

## 2021-08-30 ENCOUNTER — Emergency Department (HOSPITAL_BASED_OUTPATIENT_CLINIC_OR_DEPARTMENT_OTHER): Payer: PRIVATE HEALTH INSURANCE

## 2021-08-30 ENCOUNTER — Emergency Department (HOSPITAL_BASED_OUTPATIENT_CLINIC_OR_DEPARTMENT_OTHER)
Admission: EM | Admit: 2021-08-30 | Discharge: 2021-08-30 | Disposition: A | Discharge status: Home / Self Care | Payer: PRIVATE HEALTH INSURANCE | Attending: Emergency Medicine | Admitting: Emergency Medicine

## 2021-08-30 ENCOUNTER — Other Ambulatory Visit: Payer: Self-pay

## 2021-08-30 DIAGNOSIS — Y990 Civilian activity done for income or pay: Secondary | ICD-10-CM | POA: Diagnosis not present

## 2021-08-30 DIAGNOSIS — F424 Excoriation (skin-picking) disorder: Secondary | ICD-10-CM | POA: Insufficient documentation

## 2021-08-30 DIAGNOSIS — W19XXXA Unspecified fall, initial encounter: Secondary | ICD-10-CM | POA: Insufficient documentation

## 2021-08-30 DIAGNOSIS — T1490XA Injury, unspecified, initial encounter: Secondary | ICD-10-CM

## 2021-08-30 DIAGNOSIS — Z23 Encounter for immunization: Secondary | ICD-10-CM | POA: Diagnosis not present

## 2021-08-30 DIAGNOSIS — S61411A Laceration without foreign body of right hand, initial encounter: Secondary | ICD-10-CM | POA: Diagnosis not present

## 2021-08-30 DIAGNOSIS — T148XXA Other injury of unspecified body region, initial encounter: Secondary | ICD-10-CM

## 2021-08-30 DIAGNOSIS — S6991XA Unspecified injury of right wrist, hand and finger(s), initial encounter: Secondary | ICD-10-CM | POA: Diagnosis present

## 2021-08-30 MED ORDER — BACITRACIN ZINC 500 UNIT/GM EX OINT: TOPICAL_OINTMENT | Freq: Two times a day (BID) | CUTANEOUS | Status: DC

## 2021-08-30 MED ORDER — ACETAMINOPHEN 325 MG PO TABS
650.0000 mg | ORAL_TABLET | Freq: Once | ORAL | Status: AC
  Administered 2021-08-30: 650 mg via ORAL
  Filled 2021-08-30: qty 2

## 2021-08-30 MED ORDER — LIDOCAINE HCL (PF) 1 % IJ SOLN
10.0000 mL | Freq: Once | INTRAMUSCULAR | Status: DC
  Filled 2021-08-30: qty 10

## 2021-08-30 MED ORDER — TETANUS-DIPHTH-ACELL PERTUSSIS 5-2.5-18.5 LF-MCG/0.5 IM SUSY
0.5000 mL | PREFILLED_SYRINGE | Freq: Once | INTRAMUSCULAR | Status: AC
  Administered 2021-08-30: 0.5 mL via INTRAMUSCULAR
  Filled 2021-08-30: qty 0.5

## 2021-08-30 MED ORDER — IBUPROFEN 800 MG PO TABS
800.0000 mg | ORAL_TABLET | Freq: Once | ORAL | Status: AC
  Administered 2021-08-30: 800 mg via ORAL
  Filled 2021-08-30: qty 1

## 2021-08-30 NOTE — ED Notes (Signed)
ED Provider at bedside. 

## 2021-08-30 NOTE — ED Provider Notes (Signed)
[facility] EMERGENCY DEPARTMENT Provider Note   CSN: [id number] Arrival date & time: 08/30/21  1152     History Chief Complaint  Patient presents with   Cameron Moore is a 25 y.o. male with no significant past medical history presents after falling down below the car bay while working at [employer] 1 to 2 hours prior to arrival.  Patient reports that he has pain primarily in the right forearm and hand.  Patient denies loss of consciousness, injury to his head.  Patient reports he cleaned some lacerations on his fingers / hands and bleeding was controlled prior to arrival.  Patient reports some numbness, tingling, tight sensation in his forearm and hand.  Patient is able to walk without difficulty.  Patient does not know when his last tetanus shot was, reports likely greater than 5 years ago.   Fall      History reviewed. No pertinent past medical history.  There are no problems to display for this patient.   Past Surgical History:  Procedure Laterality Date   EYE SURGERY     HERNIA REPAIR         Family History  Problem Relation Age of Onset   Diabetes Mother     Social History   Tobacco Use   Smoking status: Never   Smokeless tobacco: Never  Vaping Use   Vaping Use: Never used  Substance Use Topics   Alcohol use: No   Drug use: No    Home Medications Prior to Admission medications   Medication Sig Start Date End Date Taking? Authorizing Provider  alum & mag hydroxide-simeth (MAALOX/MYLANTA) 200-200-20 MG/5ML suspension Take 30 mLs by mouth every 6 (six) hours as needed for indigestion or heartburn. 10/24/17   Lavera Guise, MD  famotidine (PEPCID) 20 MG tablet Take 1 tablet (20 mg total) by mouth 2 (two) times daily. 07/04/17   Arby Barrette, MD  meclizine (ANTIVERT) 25 MG tablet Take 1 tablet (25 mg total) by mouth 3 (three) times daily as needed for dizziness. 06/18/19   Horton, Mayer Masker, MD  omeprazole (PRILOSEC) 20 MG capsule  Take 1 capsule (20 mg total) by mouth daily. 10/24/17   Lavera Guise, MD    Allergies    Bee venom and Mushroom ext cmplx(shiitake-reishi-mait)  Review of Systems   Review of Systems  Skin:  Positive for wound.  All other systems reviewed and are negative.  Physical Exam Updated Vital Signs BP 129/76 (BP Location: Left Arm)   Pulse 68   Temp 98.8 F (37.1 C) (Oral)   Resp 20   Ht 6' (1.829 m)   Wt 122.5 kg   SpO2 99%   BMI 36.62 kg/m   Physical Exam Vitals and nursing note reviewed.  Constitutional:      General: He is not in acute distress.    Appearance: Normal appearance.  HENT:     Head: Normocephalic and atraumatic.  Eyes:     General:        Right eye: No discharge.        Left eye: No discharge.  Cardiovascular:     Rate and Rhythm: Normal rate and regular rhythm.     Pulses: Normal pulses.     Comments: Intact radial and ulnar pulses of the right hand.  Intact PT/DP pulses of the right leg. Pulmonary:     Effort: Pulmonary effort is normal. No respiratory distress.  Musculoskeletal:  General: No deformity.     Comments: Tenderness to palpation on the volar aspect of the right forearm proximally, extending distally with some swelling of the forearm consistent with contusion versus hematoma.  Small laceration on the palm proximal to the third and fourth digits with dried blood.  Patient is able to flex and extend the elbow without difficulty, with some pain, patient is able to flex and extend wrist without difficulty with some feeling of "tightness".  Patient has 4 out of 5 strength to flexion and extension of fingers and thumb, decreased active range of motion secondary to tightness and pain.  Skin:    General: Skin is warm and dry.     Capillary Refill: Capillary refill takes less than 2 seconds.     Comments: Multiple very shallow lacerations overlying the right wrist.  Some tenderness to palpation over the right thigh on the internal aspect.   Neurological:     Mental Status: He is alert and oriented to person, place, and time.     Sensory: No sensory deficit.  Psychiatric:        Mood and Affect: Mood normal.        Behavior: Behavior normal.    ED Results / Procedures / Treatments   Labs (all labs ordered are listed, but only abnormal results are displayed) Labs Reviewed - No data to display  EKG None  Radiology DG Forearm Right  Result Date: 08/30/2021 CLINICAL DATA:  Fall EXAM: RIGHT FOREARM - 2 VIEW COMPARISON:  None FINDINGS: Osseous mineralization normal. Joint spaces preserved. No fracture, dislocation, or bone destruction. IMPRESSION: Normal exam. Electronically Signed   By: Ulyses Southward M.D.   On: 08/30/2021 13:22   DG Hand Complete Right  Result Date: 08/30/2021 CLINICAL DATA:  Fall EXAM: RIGHT HAND - COMPLETE 3+ VIEW COMPARISON:  None FINDINGS: Osseous mineralization normal. Joint spaces preserved. No acute fracture, dislocation, or bone destruction. Well-formed ossicle at dorsal margin of carpus, developmental versus old. No definite acute osseous findings. IMPRESSION: No acute abnormalities. Electronically Signed   By: Ulyses Southward M.D.   On: 08/30/2021 13:23    Procedures Procedures   Medications Ordered in ED Medications  lidocaine (PF) (XYLOCAINE) 1 % injection 10 mL (10 mLs Infiltration Not Given 08/30/21 1453)  bacitracin ointment (has no administration in time range)  Tdap (BOOSTRIX) injection 0.5 mL (0.5 mLs Intramuscular Given 08/30/21 1444)  ibuprofen (ADVIL) tablet 800 mg (800 mg Oral Given 08/30/21 1442)  acetaminophen (TYLENOL) tablet 650 mg (650 mg Oral Given 08/30/21 1442)    ED Course  I have reviewed the triage vital signs and the nursing notes.  Pertinent labs & imaging results that were available during my care of the patient were reviewed by me and considered in my medical decision making (see chart for details).    MDM Rules/Calculators/A&P                         Intact radial and  ulnar pulses, some tightness of forearm and swelling consistent with contusion versus hematoma.  Patient has intact flexion extension of all digits, no evidence of flexor tendon injury.  No evidence of forearm compartment syndrome.  Some bruising over the thenar eminence consistent with deep tissue contusion.  Radiographic imaging does not reveal any fracture or dislocation of the right forearm.  Patient is able to walk without difficulty, normal gait.  We will clean lacerations and assess for possibility for laceration repair at this  time.  Will update tetanus vaccine.  After lacerations and excoriations on the right hand were cleaned, reveals 2 shallow flaps of skin partially connected on the volar aspect of the palm proximal to the third and fourth fingers.  Discussed with patient could potentially glue these flaps down in a may or may not heal based on remaining blood flow versus we could clip the remaining flap off bandaged with bacitracin and encourage wearing gloves at work.  Patient opts to clip the flaps off and bandage.  Right arm neurovascularly intact.  Patient denies ongoing shoulder pain.  Patient also has some inner thigh tenderness to palpation consistent with bruising.  No numbness or tingling of distal right leg.  Gait normal.  Return precautions given.  Patient discharged in stable condition. Final Clinical Impression(s) / ED Diagnoses Final diagnoses:  Injury  Fall    Rx / DC Orders ED Discharge Orders     None        Olene Floss, PA-C 08/30/21 1453    Alvira Monday, MD 08/30/21 2308

## 2021-08-30 NOTE — ED Triage Notes (Signed)
Pt reports fall at work into the area below the car bay. Pt denies + LOC. Pt c/o pain to right thigh, shoulder and arm.

## 2021-08-30 NOTE — Discharge Instructions (Signed)
Please use Tylenol or ibuprofen for pain.  You may use 600 mg ibuprofen every 6 hours or 1000 mg of Tylenol every 6 hours.  You may choose to alternate between the 2.  This would be most effective.  Not to exceed 4 g of Tylenol within 24 hours.  Not to exceed 3200 mg ibuprofen 24 hours.  I recommend that you keep open skin bandaged, apply antibacterial ointment such as Polysporin or Neosporin.  Please return if you notice any signs of infection including increasing redness, pain, draining pus.  Please return if you have any issue with ongoing right hand or arm pain.  I have attached the information of an orthopedic doctor to follow-up with as needed.

## 2022-05-10 ENCOUNTER — Other Ambulatory Visit: Payer: Self-pay

## 2022-05-10 ENCOUNTER — Emergency Department (HOSPITAL_BASED_OUTPATIENT_CLINIC_OR_DEPARTMENT_OTHER)
Admission: EM | Admit: 2022-05-10 | Discharge: 2022-05-11 | Disposition: A | Discharge status: Home / Self Care | Payer: Self-pay | Attending: Emergency Medicine | Admitting: Emergency Medicine

## 2022-05-10 DIAGNOSIS — N390 Urinary tract infection, site not specified: Secondary | ICD-10-CM | POA: Insufficient documentation

## 2022-05-10 DIAGNOSIS — R310 Gross hematuria: Secondary | ICD-10-CM | POA: Insufficient documentation

## 2022-05-10 NOTE — ED Triage Notes (Addendum)
Pt reports getting soap in his penis 2 days ago while showering, now c/o hematuria, burning with urination and dysuria. Denies penile discharge, fevers, abdominal pain, n/v.

## 2022-05-11 ENCOUNTER — Encounter (HOSPITAL_BASED_OUTPATIENT_CLINIC_OR_DEPARTMENT_OTHER): Payer: Self-pay | Admitting: Emergency Medicine

## 2022-05-11 LAB — URINALYSIS, MICROSCOPIC (REFLEX)
RBC / HPF: >50 RBC/hpf (ref 0–5)
WBC, UA: >50 WBC/hpf (ref 0–5)

## 2022-05-11 LAB — CBC WITH DIFFERENTIAL/PLATELET
Abs Immature Granulocytes: 0.06 10*3/uL (ref 0.00–0.07)
Basophils Absolute: 0.1 10*3/uL (ref 0.0–0.1)
Basophils Relative: 0 %
Eosinophils Absolute: 0.1 10*3/uL (ref 0.0–0.5)
Eosinophils Relative: 0 %
HCT: 45.2 % (ref 39.0–52.0)
Hemoglobin: 15.3 g/dL (ref 13.0–17.0)
Immature Granulocytes: 1 %
Lymphocytes Relative: 16 %
Lymphs Abs: 2 10*3/uL (ref 0.7–4.0)
MCH: 31.3 pg (ref 26.0–34.0)
MCHC: 33.8 g/dL (ref 30.0–36.0)
MCV: 92.4 fL (ref 80.0–100.0)
Monocytes Absolute: 0.9 10*3/uL (ref 0.1–1.0)
Monocytes Relative: 7 %
Neutro Abs: 9.8 10*3/uL — ABNORMAL HIGH (ref 1.7–7.7)
Neutrophils Relative %: 76 %
Platelets: 238 10*3/uL (ref 150–400)
RBC: 4.89 MIL/uL (ref 4.22–5.81)
RDW: 12.2 % (ref 11.5–15.5)
WBC: 12.9 10*3/uL — ABNORMAL HIGH (ref 4.0–10.5)
nRBC: 0 % (ref 0.0–0.2)

## 2022-05-11 LAB — URINALYSIS, ROUTINE W REFLEX MICROSCOPIC
Glucose, UA: NEGATIVE mg/dL
Ketones, ur: NEGATIVE mg/dL
Nitrite: NEGATIVE
Protein, ur: >=300 mg/dL — AB
Specific Gravity, Urine: >=1.03 (ref 1.005–1.030)
pH: 6 (ref 5.0–8.0)

## 2022-05-11 LAB — BASIC METABOLIC PANEL
Anion gap: 7 (ref 5–15)
BUN: 17 mg/dL (ref 6–20)
CO2: 23 mmol/L (ref 22–32)
Calcium: 9.3 mg/dL (ref 8.9–10.3)
Chloride: 106 mmol/L (ref 98–111)
Creatinine, Ser: 1.26 mg/dL — ABNORMAL HIGH (ref 0.61–1.24)
GFR, Estimated: >60 mL/min (ref >60)
Glucose, Bld: 88 mg/dL (ref 70–99)
Potassium: 4 mmol/L (ref 3.5–5.1)
Sodium: 136 mmol/L (ref 135–145)

## 2022-05-11 MED ORDER — CEPHALEXIN 250 MG PO CAPS
500.0000 mg | ORAL_CAPSULE | Freq: Once | ORAL | Status: AC
Start: 2022-05-11 — End: 2022-05-11
  Administered 2022-05-11: 500 mg via ORAL
  Filled 2022-05-11: qty 2

## 2022-05-11 MED ORDER — CEPHALEXIN 500 MG PO CAPS: 500.0000 mg | ORAL_CAPSULE | Freq: Four times a day (QID) | ORAL | 0 refills | Status: DC

## 2022-05-11 NOTE — ED Provider Notes (Signed)
[facility] EMERGENCY DEPARTMENT Provider Note   CSN: [id number] Arrival date & time: 05/10/22  2348     History  Chief Complaint  Patient presents with   Hematuria    Cameron Moore is a 26 y.o. male.  The history is provided by the patient.  Hematuria  Cameron Moore is a 26 y.o. male who presents to the Emergency Department complaining of hematuria.  He presents to the emergency department for evaluation of hematuria that started earlier today.  He states that he got a new body wash a few days ago and accidentally got on his urethra.  He has experienced some burning with urination since then.  Today when he went to urinate there was gross blood in his urine.  He has associated urgency and frequency.  No fevers, nausea, flank pain, abdominal pain.  No prior similar symptoms.  He is not currently sexually active.  He has no known medical problems and takes no medications.  He has a family history of diabetes, no history of bleeding disorders.     Home Medications Prior to Admission medications   Medication Sig Start Date End Date Taking? Authorizing Provider  cephALEXin (KEFLEX) 500 MG capsule Take 1 capsule (500 mg total) by mouth 4 (four) times daily. 05/11/22  Yes Tilden Fossa, MD      Allergies    Patient has no known allergies.    Review of Systems   Review of Systems  Genitourinary:  Positive for hematuria.  All other systems reviewed and are negative.   Physical Exam Updated Vital Signs BP (!) 144/84   Pulse 86   Temp 99.2 F (37.3 C) (Oral)   Resp 16   SpO2 100%  Physical Exam Vitals and nursing note reviewed.  Constitutional:      Appearance: He is well-developed.  HENT:     Head: Normocephalic and atraumatic.  Cardiovascular:     Rate and Rhythm: Normal rate and regular rhythm.  Pulmonary:     Effort: Pulmonary effort is normal. No respiratory distress.  Abdominal:     Palpations: Abdomen is soft.     Tenderness: There is no abdominal  tenderness. There is no guarding or rebound.  Genitourinary:    Penis: Normal.   Musculoskeletal:        General: No tenderness.  Skin:    General: Skin is warm and dry.  Neurological:     Mental Status: He is alert and oriented to person, place, and time.  Psychiatric:        Behavior: Behavior normal.     ED Results / Procedures / Treatments   Labs (all labs ordered are listed, but only abnormal results are displayed) Labs Reviewed  URINALYSIS, ROUTINE W REFLEX MICROSCOPIC - Abnormal; Notable for the following components:      Result Value   Color, Urine AMBER (*)    APPearance TURBID (*)    Hgb urine dipstick LARGE (*)    Bilirubin Urine SMALL (*)    Protein, ur >=300 (*)    Leukocytes,Ua SMALL (*)    All other components within normal limits  URINALYSIS, MICROSCOPIC (REFLEX) - Abnormal; Notable for the following components:   Bacteria, UA FEW (*)    Non Squamous Epithelial PRESENT (*)    All other components within normal limits  BASIC METABOLIC PANEL - Abnormal; Notable for the following components:   Creatinine, Ser 1.26 (*)    All other components within normal limits  CBC WITH DIFFERENTIAL/PLATELET -  Abnormal; Notable for the following components:   WBC 12.9 (*)    Neutro Abs 9.8 (*)    All other components within normal limits  URINE CULTURE  GC/CHLAMYDIA PROBE AMP ([place]) NOT AT [facility]    EKG None  Radiology No results found.  Procedures Procedures    Medications Ordered in ED Medications  cephALEXin (KEFLEX) capsule 500 mg (has no administration in time range)    ED Course/ Medical Decision Making/ A&P                           Medical Decision Making Amount and/or Complexity of Data Reviewed Labs: ordered.  Risk Prescription drug management.   Patient here for evaluation of hematuria.  He is nontoxic-appearing on evaluation and in no acute distress.  No historical features concerning for obstructing stone.  UA is concerning for UTI  in setting of his symptoms, will send culture and start antibiotics.  Discussed with patient home care for hematuria, UTI.  Discussed outpatient follow-up and return precautions.        Final Clinical Impression(s) / ED Diagnoses Final diagnoses:  Acute UTI  Gross hematuria    Rx / DC Orders ED Discharge Orders          Ordered    cephALEXin (KEFLEX) 500 MG capsule  4 times daily        05/11/22 0053              Tilden Fossa, MD 05/11/22 [phone]

## 2022-05-12 LAB — GC/CHLAMYDIA PROBE AMP (~~LOC~~) NOT AT ARMC
Chlamydia: NEGATIVE
Comment: NEGATIVE
Comment: NORMAL
Neisseria Gonorrhea: NEGATIVE

## 2022-05-13 LAB — URINE CULTURE: Culture: >=100000 — AB

## 2022-05-14 ENCOUNTER — Encounter (HOSPITAL_BASED_OUTPATIENT_CLINIC_OR_DEPARTMENT_OTHER): Payer: Self-pay | Admitting: Emergency Medicine

## 2022-05-14 ENCOUNTER — Telehealth: Payer: Self-pay | Admitting: *Deleted

## 2022-09-20 ENCOUNTER — Other Ambulatory Visit: Payer: Self-pay

## 2022-09-20 ENCOUNTER — Emergency Department (HOSPITAL_BASED_OUTPATIENT_CLINIC_OR_DEPARTMENT_OTHER): Payer: Self-pay

## 2022-09-20 ENCOUNTER — Encounter (HOSPITAL_BASED_OUTPATIENT_CLINIC_OR_DEPARTMENT_OTHER): Payer: Self-pay | Admitting: Emergency Medicine

## 2022-09-20 ENCOUNTER — Emergency Department (HOSPITAL_BASED_OUTPATIENT_CLINIC_OR_DEPARTMENT_OTHER)
Admission: EM | Admit: 2022-09-20 | Discharge: 2022-09-20 | Disposition: A | Discharge status: Home / Self Care | Payer: Self-pay | Attending: Emergency Medicine | Admitting: Emergency Medicine

## 2022-09-20 DIAGNOSIS — R109 Unspecified abdominal pain: Secondary | ICD-10-CM | POA: Insufficient documentation

## 2022-09-20 DIAGNOSIS — R0789 Other chest pain: Secondary | ICD-10-CM | POA: Insufficient documentation

## 2022-09-20 DIAGNOSIS — K0889 Other specified disorders of teeth and supporting structures: Secondary | ICD-10-CM | POA: Insufficient documentation

## 2022-09-20 LAB — D-DIMER, QUANTITATIVE: D-Dimer, Quant: <0.27 ug/mL-FEU (ref 0.00–0.50)

## 2022-09-20 LAB — BASIC METABOLIC PANEL
Anion gap: 8 (ref 5–15)
BUN: 21 mg/dL — ABNORMAL HIGH (ref 6–20)
CO2: 26 mmol/L (ref 22–32)
Calcium: 9.5 mg/dL (ref 8.9–10.3)
Chloride: 104 mmol/L (ref 98–111)
Creatinine, Ser: 1.11 mg/dL (ref 0.61–1.24)
GFR, Estimated: >60 mL/min (ref >60)
Glucose, Bld: 88 mg/dL (ref 70–99)
Potassium: 4.1 mmol/L (ref 3.5–5.1)
Sodium: 138 mmol/L (ref 135–145)

## 2022-09-20 LAB — CBC
HCT: 45.9 % (ref 39.0–52.0)
Hemoglobin: 15.6 g/dL (ref 13.0–17.0)
MCH: 31.2 pg (ref 26.0–34.0)
MCHC: 34 g/dL (ref 30.0–36.0)
MCV: 91.8 fL (ref 80.0–100.0)
Platelets: 251 10*3/uL (ref 150–400)
RBC: 5 MIL/uL (ref 4.22–5.81)
RDW: 12 % (ref 11.5–15.5)
WBC: 5.8 10*3/uL (ref 4.0–10.5)
nRBC: 0 % (ref 0.0–0.2)

## 2022-09-20 LAB — HEPATIC FUNCTION PANEL
ALT: 31 U/L (ref 0–44)
AST: 32 U/L (ref 15–41)
Albumin: 4 g/dL (ref 3.5–5.0)
Alkaline Phosphatase: 49 U/L (ref 38–126)
Bilirubin, Direct: 0.2 mg/dL (ref 0.0–0.2)
Indirect Bilirubin: 1.1 mg/dL — ABNORMAL HIGH (ref 0.3–0.9)
Total Bilirubin: 1.3 mg/dL — ABNORMAL HIGH (ref 0.3–1.2)
Total Protein: 7.7 g/dL (ref 6.5–8.1)

## 2022-09-20 LAB — TROPONIN I (HIGH SENSITIVITY)
Troponin I (High Sensitivity): 3 ng/L (ref <18)
Troponin I (High Sensitivity): 3 ng/L (ref <18)

## 2022-09-20 LAB — LIPASE, BLOOD: Lipase: 31 U/L (ref 11–51)

## 2022-09-20 MED ORDER — DICLOFENAC SODIUM 1 % EX GEL: 2.0000 g | Freq: Four times a day (QID) | CUTANEOUS | 0 refills | Status: DC | PRN

## 2022-09-20 MED ORDER — AMOXICILLIN-POT CLAVULANATE 875-125 MG PO TABS: 1.0000 | ORAL_TABLET | Freq: Two times a day (BID) | ORAL | 0 refills | Status: AC

## 2022-09-20 NOTE — ED Provider Notes (Signed)
Emergency Department Provider Note   I have reviewed the triage vital signs and the nursing notes.   HISTORY  Chief Complaint Chest Pain   HPI Cameron Moore is a 26 y.o. male presents to the emergency department for evaluation of dental pain but also discomfort through his abdomen and chest.  Patient states he is having pain to the roof of his mouth and right rear molar.  No difficulty swallowing, voice change, difficulty breathing.  No swelling under the tongue.  In triage, he mentioned also having tight/sharp sensation radiating from his abdomen up to his chest.  Symptoms are worse with bending over or lifting.  No exertional symptoms, diaphoresis, shortness of breath symptoms.   History reviewed. No pertinent past medical history.  Review of Systems  Constitutional: No fever/chills Eyes: No visual changes. ENT: No sore throat. Positive dental pain.  Cardiovascular: Positive chest pain. Respiratory: Denies shortness of breath. Gastrointestinal: Positive abdominal pain.  No nausea, no vomiting.  No diarrhea.  No constipation. Genitourinary: Negative for dysuria. Musculoskeletal: Negative for back pain. Skin: Negative for rash. Neurological: Negative for headaches, focal weakness or numbness.  ____________________________________________   PHYSICAL EXAM:  VITAL SIGNS: ED Triage Vitals  Enc Vitals Group     BP 09/20/22 1300 110/75     Pulse Rate 09/20/22 1300 86     Resp 09/20/22 1300 18     Temp 09/20/22 1300 98.9 F (37.2 C)     Temp Source 09/20/22 1300 Oral     SpO2 09/20/22 1300 96 %     Weight 09/20/22 1301 289 lb (131.1 kg)     Height 09/20/22 1301 6' (1.829 m)   Constitutional: Alert and oriented. Well appearing and in no acute distress. Eyes: Conjunctivae are normal. Head: Atraumatic. Nose: No congestion/rhinnorhea. Mouth/Throat: Mucous membranes are moist. No visible dental abscess. No trismus. Clear posterior pharynx. Soft submandibular  compartment.  Neck: No stridor. Cardiovascular: Normal rate, regular rhythm. Good peripheral circulation. Grossly normal heart sounds.   Respiratory: Normal respiratory effort.  No retractions. Lungs CTAB. Gastrointestinal: Soft and nontender. No distention.  Musculoskeletal: No lower extremity tenderness nor edema. No gross deformities of extremities. Neurologic:  Normal speech and language. No gross focal neurologic deficits are appreciated.  Skin:  Skin is warm, dry and intact. No rash noted.  ____________________________________________   LABS (all labs ordered are listed, but only abnormal results are displayed)  Labs Reviewed  BASIC METABOLIC PANEL - Abnormal; Notable for the following components:      Result Value   BUN 21 (*)    All other components within normal limits  HEPATIC FUNCTION PANEL - Abnormal; Notable for the following components:   Total Bilirubin 1.3 (*)    Indirect Bilirubin 1.1 (*)    All other components within normal limits  CBC  LIPASE, BLOOD  D-DIMER, QUANTITATIVE  TROPONIN I (HIGH SENSITIVITY)  TROPONIN I (HIGH SENSITIVITY)   ____________________________________________  EKG   EKG Interpretation  Date/Time:  Monday September 20 2022 13:03:34 EDT Ventricular Rate:  77 PR Interval:  156 QRS Duration: 88 QT Interval:  338 QTC Calculation: 382 R Axis:   76 Text Interpretation: Normal sinus rhythm Nonspecific ST and T wave abnormality Abnormal ECG When compared with ECG of 04-Jul-2017 08:42, PREVIOUS ECG IS PRESENT Confirmed by Alona Bene [phone]) on 09/20/2022 1:15:08 PM        ____________________________________________  RADIOLOGY  DG Chest 2 View  Result Date: 09/20/2022 CLINICAL DATA:  Chest pain EXAM: CHEST -  2 VIEW COMPARISON:  Chest x-ray dated November 17, 2014 FINDINGS: The heart size and mediastinal contours are within normal limits. Both lungs are clear. The visualized skeletal structures are unremarkable. IMPRESSION: No active  cardiopulmonary disease. Electronically Signed   By: Yetta Glassman M.D.   On: 09/20/2022 13:27    ____________________________________________   PROCEDURES  Procedure(s) performed:   Procedures  None  ____________________________________________   INITIAL IMPRESSION / ASSESSMENT AND PLAN / ED COURSE  Pertinent labs & imaging results that were available during my care of the patient were reviewed by me and considered in my medical decision making (see chart for details).   This patient is Presenting for Evaluation of CP, which does require a range of treatment options, and is a complaint that involves a high risk of morbidity and mortality.  The Differential Diagnoses includes but is not exclusive to acute coronary syndrome, aortic dissection, pulmonary embolism, cardiac tamponade, community-acquired pneumonia, pericarditis, musculoskeletal chest wall pain, etc.    Clinical Laboratory Tests Ordered, included Troponin and d dimer are negative. LFTs normal. No leukocytosis.   Radiologic Tests Ordered, included CXR. I independently interpreted the images and agree with radiology interpretation.   Cardiac Monitor Tracing which shows NSR.    Social Determinants of Health Risk patient is a non-smoker.   Medical Decision Making: Summary:  Patient presents emergency department evaluation of dental pain primarily but also mentions chest and abdominal discomfort which seems musculoskeletal in nature given the worsening symptoms with bending over or lifting.  No visible dental abscess or need for neck/face imaging.  Plan for antibiotic and patient will contact his dentist for follow-up.  Work-up for chest/abdominal pain is also unremarkable.  Plan for MSK strain treatment and close PCP follow up.    Disposition: discharge  ____________________________________________  FINAL CLINICAL IMPRESSION(S) / ED DIAGNOSES  Final diagnoses:  Pain, dental  Atypical chest pain     NEW  OUTPATIENT MEDICATIONS STARTED DURING THIS VISIT:  Discharge Medication List as of 09/20/2022  2:41 PM     START taking these medications   Details  amoxicillin-clavulanate (AUGMENTIN) 875-125 MG tablet Take 1 tablet by mouth every 12 (twelve) hours for 7 days., Starting Mon 09/20/2022, Until Mon 09/27/2022, Normal    diclofenac Sodium (VOLTAREN) 1 % GEL Apply 2 g topically 4 (four) times daily as needed., Starting Mon 09/20/2022, Normal        Note:  This document was prepared using Dragon voice recognition software and may include unintentional dictation errors.  Nanda Quinton, MD, [facility]    Janete Quilling, [facility], MD 09/21/22 [phone]

## 2022-09-20 NOTE — ED Triage Notes (Signed)
Pt arrives pov, steady gait, reports while at work last night, had a sharp LLQ pain that radiated to LT side CP intermittently that caused dizziness. Also c/o front upper oral pain x 2 days. Denies CP at this time

## 2022-09-20 NOTE — ED Notes (Signed)
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 Spoke with EDP and will be able to discharge. Reviewed discharge instructions, recommendations and medications with pt. Questions answered. Pt states understanding and discharged at this tiime

## 2022-09-20 NOTE — Discharge Instructions (Signed)
You are seen emergency room today with dental pain but also discomfort in your belly and abdomen.  You are leaving before some of your lab work is done.  Please follow these test results in the MyChart app and follow closely with your primary care physician.  If develop any new or suddenly worsening pain in your chest, trouble breathing, severe abdominal pain you should return to the emergency department immediately.  Please start antibiotics for your dental pain and follow-up with your dentist next week as scheduled.

## 2022-09-22 ENCOUNTER — Encounter (HOSPITAL_BASED_OUTPATIENT_CLINIC_OR_DEPARTMENT_OTHER): Payer: Self-pay | Admitting: Emergency Medicine

## 2022-09-22 ENCOUNTER — Emergency Department (HOSPITAL_BASED_OUTPATIENT_CLINIC_OR_DEPARTMENT_OTHER)
Admission: EM | Admit: 2022-09-22 | Discharge: 2022-09-22 | Disposition: A | Discharge status: Home / Self Care | Payer: Self-pay | Attending: Emergency Medicine | Admitting: Emergency Medicine

## 2022-09-22 ENCOUNTER — Other Ambulatory Visit: Payer: Self-pay

## 2022-09-22 DIAGNOSIS — K0510 Chronic gingivitis, plaque induced: Secondary | ICD-10-CM | POA: Insufficient documentation

## 2022-09-22 MED ORDER — LIDOCAINE VISCOUS HCL 2 % MT SOLN: OROMUCOSAL | 0 refills | Status: DC

## 2022-09-22 MED ORDER — CHLORHEXIDINE GLUCONATE 0.12 % MT SOLN: OROMUCOSAL | 0 refills | Status: DC

## 2022-09-22 MED ORDER — LIDOCAINE VISCOUS HCL 2 % MT SOLN
15.0000 mL | Freq: Once | OROMUCOSAL | Status: AC
  Administered 2022-09-22: 15 mL via OROMUCOSAL
  Filled 2022-09-22: qty 15

## 2022-09-22 NOTE — ED Provider Notes (Signed)
[facility] Provider Note: Georgena Spurling, MD, FACEP  CSN: [id number] MRN: [id number] ARRIVAL: 09/22/22 at [place]: [facility]   HISTORY OF PRESENT ILLNESS  09/22/22 4:56 AM Cameron Moore is a 26 y.o. male with 3 days of mouth pain.  The mouth pain involves the soft tissue of his mouth, notably the tip of the tongue and the gum tissue posterior to his upper teeth.  The pain makes it difficult to eat.  He rates the pain as a 10 out of 10.  He was seen for this 2 days and started on Augmentin.   History reviewed. No pertinent past medical history.  Past Surgical History:  Procedure Laterality Date   EYE SURGERY     HERNIA REPAIR      Family History  Problem Relation Age of Onset   Sudden death Neg Hx    Heart attack Neg Hx    Diabetes Mother     Social History   Tobacco Use   Smoking status: Never   Smokeless tobacco: Never  Vaping Use   Vaping Use: Never used  Substance Use Topics   Alcohol use: Yes    Comment: occ   Drug use: Never    Prior to Admission medications   Medication Sig Start Date End Date Taking? Authorizing Provider  amoxicillin-clavulanate (AUGMENTIN) 875-125 MG tablet Take 1 tablet by mouth every 12 (twelve) hours for 7 days. 09/20/22 09/27/22  Long, Wonda Olds, MD  diclofenac Sodium (VOLTAREN) 1 % GEL Apply 2 g topically 4 (four) times daily as needed. 09/20/22   Long, Wonda Olds, MD    Allergies Bee venom and Mushroom ext cmplx(shiitake-reishi-mait)   REVIEW OF SYSTEMS  Negative except as noted here or in the History of Present Illness.   PHYSICAL EXAMINATION  Initial Vital Signs Blood pressure 124/77, pulse 81, temperature 98.3 F (36.8 C), temperature source Oral, resp. rate 18, height 6' (1.829 m), weight 131.1 kg, SpO2 95 %.  Examination General: Well-developed, well-nourished male in no acute distress; appearance consistent with age of record HENT: normocephalic; atraumatic;  inflammatory changes of the gums, notably posterior to the upper teeth and on the tip of the tongue; halitosis noted Eyes: Normal appearance Neck: supple Heart: regular rate and rhythm Lungs: clear to auscultation bilaterally Abdomen: soft; nondistended; nontender; bowel sounds present Extremities: No deformity; full range of motion Neurologic: Awake, alert and oriented; motor function intact in all extremities and symmetric; no facial droop Skin: Warm and dry Psychiatric: Normal mood and affect   RESULTS  Summary of this visit's results, reviewed and interpreted by myself:   EKG Interpretation  Date/Time:    Ventricular Rate:    PR Interval:    QRS Duration:   QT Interval:    QTC Calculation:   R Axis:     Text Interpretation:         Laboratory Studies: No results found for this or any previous visit (from the past 24 hour(s)). Imaging Studies: DG Chest 2 View  Result Date: 09/20/2022 CLINICAL DATA:  Chest pain EXAM: CHEST - 2 VIEW COMPARISON:  Chest x-ray dated November 17, 2014 FINDINGS: The heart size and mediastinal contours are within normal limits. Both lungs are clear. The visualized skeletal structures are unremarkable. IMPRESSION: No active cardiopulmonary disease. Electronically Signed   By: Yetta Glassman M.D.   On: 09/20/2022 13:27    ED COURSE and MDM  Nursing notes, initial and subsequent vitals signs,  including pulse oximetry, reviewed and interpreted by myself.  Vitals:   09/22/22 0453 09/22/22 0456 09/22/22 0456  BP: 124/77    Pulse: 81    Resp:   18  Temp:  98.3 F (36.8 C)   TempSrc:  Oral   SpO2: 95%    Weight: 131.1 kg    Height: 6' (1.829 m)     Medications - No data to display  The patient's examination is consistent with gingivitis.  It does not yet appear to have progressed to ANUG but the severity of the patient's symptoms suggest it is worsening.  He is already on Augmentin but does not like taking the Augmentin because it upsets  his stomach if he does not eat food with it and the pain in his mouth hurts too much to eat.  He states he cannot get into a dentist because he does not have any money to pay for the dentist.  He was advised that dental follow-up is important even if he has to pay out-of-pocket.  We will refer him to the dentist on-call.  We will add oral rinses and topical anesthesia in the meantime.  PROCEDURES  Procedures   ED DIAGNOSES     ICD-10-CM   1. Gingivitis  K05.10          Taelor Waymire, Jonny Ruiz, MD 09/22/22 [phone]

## 2022-09-22 NOTE — ED Triage Notes (Signed)
Pt c/o dental pain x 3 days. Pt states that he was seen here on 10/30 and pain has increased since

## 2022-09-23 ENCOUNTER — Emergency Department (HOSPITAL_BASED_OUTPATIENT_CLINIC_OR_DEPARTMENT_OTHER)
Admission: EM | Admit: 2022-09-23 | Discharge: 2022-09-23 | Disposition: A | Discharge status: Home / Self Care | Payer: Self-pay | Attending: Emergency Medicine | Admitting: Emergency Medicine

## 2022-09-23 ENCOUNTER — Encounter (HOSPITAL_BASED_OUTPATIENT_CLINIC_OR_DEPARTMENT_OTHER): Payer: Self-pay | Admitting: Emergency Medicine

## 2022-09-23 ENCOUNTER — Telehealth (HOSPITAL_BASED_OUTPATIENT_CLINIC_OR_DEPARTMENT_OTHER): Payer: Self-pay | Admitting: Emergency Medicine

## 2022-09-23 DIAGNOSIS — K0889 Other specified disorders of teeth and supporting structures: Secondary | ICD-10-CM | POA: Insufficient documentation

## 2022-09-23 MED ORDER — LIDOCAINE VISCOUS HCL 2 % MT SOLN: 5.0000 mL | Freq: Three times a day (TID) | OROMUCOSAL | 0 refills | Status: AC

## 2022-09-23 MED ORDER — MAALOX MAX 400-400-40 MG/5ML PO SUSP: 10.0000 mL | Freq: Four times a day (QID) | ORAL | 0 refills | Status: DC | PRN

## 2022-09-23 MED ORDER — BENADRYL ALLERGY CHILDRENS 12.5-5 MG/5ML PO SOLN: 12.5000 mg | Freq: Four times a day (QID) | ORAL | 0 refills | Status: DC | PRN

## 2022-09-23 MED ORDER — LIDOCAINE VISCOUS HCL 2 % MT SOLN: 15.0000 mL | OROMUCOSAL | 0 refills | Status: DC | PRN

## 2022-09-23 NOTE — Discharge Instructions (Signed)
Follow up with your dentist in the office.

## 2022-09-23 NOTE — Telephone Encounter (Signed)
Patient called to report that the Magic mouthwash prescription cannot be filled at the pharmacy.  Thus, we will order the prescription for the lidocaine and he can mix it himself with Benadryl and Maalox.

## 2022-09-23 NOTE — ED Provider Notes (Signed)
[facility] EMERGENCY DEPARTMENT Provider Note   CSN: [id number] Arrival date & time: 09/23/22  0024     History  Chief Complaint  Patient presents with   Dental Pain    Cameron Moore is a 25 y.o. male.  26 yo M with a chief complaints of dental pain.  Patient has been struggling with this for the past few days.  This is now his third visit to the emergency department for the same.  Has been started on antibiotics at the onset.  Has had difficulty tolerating anything by mouth and so has not wanted to take his ibuprofen or Tylenol.  Has tried different soft foods and has trouble whenever he tries to chew.  He has an appointment with a dentist on Monday.   Dental Pain      Home Medications Prior to Admission medications   Medication Sig Start Date End Date Taking? Authorizing Provider  magic mouthwash (lidocaine, diphenhydrAMINE, alum & mag hydroxide) suspension Swish and spit 5 mLs 3 (three) times daily for 24 days. 09/23/22 10/17/22 Yes Deno Etienne, DO  amoxicillin-clavulanate (AUGMENTIN) 875-125 MG tablet Take 1 tablet by mouth every 12 (twelve) hours for 7 days. 09/20/22 09/27/22  Long, Wonda Olds, MD  chlorhexidine (PERIDEX) 0.12 % solution Take 15 mL and swish around your mouth for 30 seconds twice daily.  Spit it out after swishing. 09/22/22   Molpus, John, MD  diclofenac Sodium (VOLTAREN) 1 % GEL Apply 2 g topically 4 (four) times daily as needed. 09/20/22   Long, Wonda Olds, MD  lidocaine (XYLOCAINE) 2 % solution Swish 77mL around the mouth, then spit out, every 3 hours as needed for pain 09/22/22   Molpus, Jenny Reichmann, MD      Allergies    Bee venom and Mushroom ext cmplx(shiitake-reishi-mait)    Review of Systems   Review of Systems  Physical Exam Updated Vital Signs BP 111/75 (BP Location: Right Arm)   Pulse 96   Temp 98.6 F (37 C)   Resp 20   Ht 6' (1.829 m)   Wt 105.2 kg   SpO2 98%   BMI 31.46 kg/m  Physical Exam Vitals and nursing note reviewed.   Constitutional:      Appearance: He is well-developed.  HENT:     Head: Normocephalic and atraumatic.     Mouth/Throat:     Comments: The patient has posterior nasal drip and mild tonsillar swelling left greater than right.  He has some erythema along the gums just posteriorly to the front incisors.  No appreciable fluctuance or edema. Eyes:     Pupils: Pupils are equal, round, and reactive to light.  Neck:     Vascular: No JVD.  Cardiovascular:     Rate and Rhythm: Normal rate and regular rhythm.     Heart sounds: No murmur heard.    No friction rub. No gallop.  Pulmonary:     Effort: No respiratory distress.     Breath sounds: No wheezing.  Abdominal:     General: There is no distension.     Tenderness: There is no abdominal tenderness. There is no guarding or rebound.  Musculoskeletal:        General: Normal range of motion.     Cervical back: Normal range of motion and neck supple.  Skin:    Coloration: Skin is not pale.     Findings: No rash.  Neurological:     Mental Status: He is alert and oriented to person,  place, and time.  Psychiatric:        Behavior: Behavior normal.     ED Results / Procedures / Treatments   Labs (all labs ordered are listed, but only abnormal results are displayed) Labs Reviewed - No data to display  EKG None  Radiology No results found.  Procedures Procedures    Medications Ordered in ED Medications - No data to display  ED Course/ Medical Decision Making/ A&P                           Medical Decision Making  26 yo M with a chief complaints of pain along the gumline.  This has been going on for a few days now.  This is now his third visit to the emergency department in 3 days.  He is well-appearing and nontoxic.  Sounds like he has been struggling figuring out what diet to use and what medications to take at home.  I had a long discussion with this with the patient at bedside.  Encouraged him to try a diet that he can  tolerate.  We will attempt to prescribe Magic mouthwash for him at home.  PCP follow-up.  1:00 AM:  I have discussed the diagnosis/risks/treatment options with the patient.  Evaluation and diagnostic testing in the emergency department does not suggest an emergent condition requiring admission or immediate intervention beyond what has been performed at this time.  They will follow up with PCP, dentistry. We also discussed returning to the ED immediately if new or worsening sx occur. We discussed the sx which are most concerning (e.g., sudden worsening pain, fever, inability to tolerate by mouth) that necessitate immediate return. Medications administered to the patient during their visit and any new prescriptions provided to the patient are listed below.  Medications given during this visit Medications - No data to display   The patient appears reasonably screen and/or stabilized for discharge and I doubt any other medical condition or other [facility] requiring further screening, evaluation, or treatment in the ED at this time prior to discharge.          Final Clinical Impression(s) / ED Diagnoses Final diagnoses:  Pain, dental    Rx / DC Orders ED Discharge Orders          Ordered    magic mouthwash (lidocaine, diphenhydrAMINE, alum & mag hydroxide) suspension  3 times daily       Note to Pharmacy: 1:1:1   09/23/22 0055              Melene Plan, DO 09/23/22 0100

## 2022-09-23 NOTE — ED Triage Notes (Signed)
Pt states he was seen here for the past two nights for same  Pt states he states tonight he has light headedness  Pt states he is having mouth pain and when he tries to eat he can not due to the pain    Pt states not being able to eat is making him feel bad

## 2023-01-09 ENCOUNTER — Other Ambulatory Visit: Payer: Self-pay

## 2023-01-09 ENCOUNTER — Encounter (HOSPITAL_BASED_OUTPATIENT_CLINIC_OR_DEPARTMENT_OTHER): Payer: Self-pay | Admitting: Emergency Medicine

## 2023-01-09 ENCOUNTER — Emergency Department (HOSPITAL_BASED_OUTPATIENT_CLINIC_OR_DEPARTMENT_OTHER): Payer: Self-pay

## 2023-01-09 ENCOUNTER — Emergency Department (HOSPITAL_BASED_OUTPATIENT_CLINIC_OR_DEPARTMENT_OTHER)
Admission: EM | Admit: 2023-01-09 | Discharge: 2023-01-09 | Disposition: A | Discharge status: Home / Self Care | Payer: Self-pay | Attending: Emergency Medicine | Admitting: Emergency Medicine

## 2023-01-09 DIAGNOSIS — R197 Diarrhea, unspecified: Secondary | ICD-10-CM | POA: Insufficient documentation

## 2023-01-09 DIAGNOSIS — R079 Chest pain, unspecified: Secondary | ICD-10-CM | POA: Insufficient documentation

## 2023-01-09 DIAGNOSIS — R42 Dizziness and giddiness: Secondary | ICD-10-CM | POA: Insufficient documentation

## 2023-01-09 LAB — BASIC METABOLIC PANEL WITH GFR
Anion gap: 7 (ref 5–15)
BUN: 19 mg/dL (ref 6–20)
CO2: 25 mmol/L (ref 22–32)
Calcium: 9.2 mg/dL (ref 8.9–10.3)
Chloride: 102 mmol/L (ref 98–111)
Creatinine, Ser: 1.13 mg/dL (ref 0.61–1.24)
GFR, Estimated: >60 mL/min
Glucose, Bld: 84 mg/dL (ref 70–99)
Potassium: 3.9 mmol/L (ref 3.5–5.1)
Sodium: 134 mmol/L — ABNORMAL LOW (ref 135–145)

## 2023-01-09 LAB — TROPONIN I (HIGH SENSITIVITY)
Troponin I (High Sensitivity): 2 ng/L
Troponin I (High Sensitivity): <2 ng/L

## 2023-01-09 LAB — CBC
HCT: 51 % (ref 39.0–52.0)
Hemoglobin: 17.4 g/dL — ABNORMAL HIGH (ref 13.0–17.0)
MCH: 31.4 pg (ref 26.0–34.0)
MCHC: 34.1 g/dL (ref 30.0–36.0)
MCV: 92.1 fL (ref 80.0–100.0)
Platelets: 253 K/uL (ref 150–400)
RBC: 5.54 MIL/uL (ref 4.22–5.81)
RDW: 11.9 % (ref 11.5–15.5)
WBC: 6.3 K/uL (ref 4.0–10.5)
nRBC: 0 % (ref 0.0–0.2)

## 2023-01-09 LAB — GLUCOSE, CAPILLARY: Glucose-Capillary: 81 mg/dL (ref 70–99)

## 2023-01-09 MED ORDER — ONDANSETRON HCL 4 MG/2ML IJ SOLN
4.0000 mg | Freq: Once | INTRAMUSCULAR | Status: AC
  Administered 2023-01-09: 4 mg via INTRAVENOUS
  Filled 2023-01-09: qty 2

## 2023-01-09 MED ORDER — SODIUM CHLORIDE 0.9 % IV BOLUS
1000.0000 mL | Freq: Once | INTRAVENOUS | Status: AC
  Administered 2023-01-09: 1000 mL via INTRAVENOUS

## 2023-01-09 MED ORDER — MECLIZINE HCL 25 MG PO TABS
25.0000 mg | ORAL_TABLET | Freq: Once | ORAL | Status: AC
  Administered 2023-01-09: 25 mg via ORAL
  Filled 2023-01-09: qty 1

## 2023-01-09 MED ORDER — MECLIZINE HCL 25 MG PO TABS: 25.0000 mg | ORAL_TABLET | Freq: Three times a day (TID) | ORAL | 0 refills | Status: DC | PRN

## 2023-01-09 NOTE — ED Provider Notes (Signed)
[facility] Provider Note   CSN: [id number] Arrival date & time: 01/09/23  1257     History  Chief Complaint  Patient presents with   Chest Pain    Cameron Moore is a 27 y.o. male.  Patient reports he has had sharp pains in his chest on and off since moving boxes yesterday.  Patient reports that he is also lightheaded and has a headache.  Patient reports he has been having diarrhea for the past 2 days.  Patient reports the dizziness feels like vertigo which he has had in the past.  Patient reports that he had similar symptoms in the past when he was dehydrated.  Patient reports previously treated with IV fluids and meclizine with good results.  Patient reports that the pain in his chest is sharp and shooting and last for a second and then resolves.  Patient denies any shortness of breath he denies any fever.  Patient complains of feeling achy  The history is provided by the patient. No language interpreter was used.  Chest Pain Associated symptoms: dizziness        Home Medications Prior to Admission medications   Medication Sig Start Date End Date Taking? Authorizing Provider  alum & mag hydroxide-simeth (MAALOX MAX) 400-400-40 MG/5ML suspension Take 10 mLs by mouth every 6 (six) hours as needed for indigestion. 09/23/22   Tegeler, Gwenyth Allegra, MD  chlorhexidine (PERIDEX) 0.12 % solution Take 15 mL and swish around your mouth for 30 seconds twice daily.  Spit it out after swishing. 09/22/22   Molpus, John, MD  diclofenac Sodium (VOLTAREN) 1 % GEL Apply 2 g topically 4 (four) times daily as needed. 09/20/22   Long, Wonda Olds, MD  diphenhydrAMINE-Phenylephrine (BENADRYL ALLERGY CHILDRENS) 12.5-5 MG/5ML SOLN Take 12.5 mg by mouth every 6 (six) hours as needed. Please mix 1 part Benadryl, 1 part Maalox, and 1 part viscous lidocaine then swish, gargle, and spit 1-2 teaspoonfuls every 4-6 hours as needed.  Do not swallow.  Shake well  before eating. 09/23/22   Tegeler, Gwenyth Allegra, MD  lidocaine (XYLOCAINE) 2 % solution Swish 75m around the mouth, then spit out, every 3 hours as needed for pain 09/22/22   Molpus, JJenny Reichmann MD  lidocaine (XYLOCAINE) 2 % solution Use as directed 15 mLs in the mouth or throat as needed for mouth pain. 09/23/22   Tegeler, CGwenyth Allegra MD      Allergies    Bee venom and Mushroom ext cmplx(shiitake-reishi-mait)    Review of Systems   Review of Systems  Cardiovascular:  Positive for chest pain.  Gastrointestinal:  Positive for diarrhea.  Neurological:  Positive for dizziness.  All other systems reviewed and are negative.   Physical Exam Updated Vital Signs BP (!) 109/54   Pulse 73   Temp 99.5 F (37.5 C) (Oral)   Resp (!) 26   Ht 6' (1.829 m)   Wt 104.3 kg   SpO2 100%   BMI 31.19 kg/m  Physical Exam Vitals and nursing note reviewed.  Constitutional:      Appearance: He is well-developed.  HENT:     Head: Normocephalic.  Cardiovascular:     Rate and Rhythm: Normal rate and regular rhythm.     Heart sounds: Normal heart sounds.  Pulmonary:     Effort: Pulmonary effort is normal.     Breath sounds: Normal breath sounds.  Abdominal:     General: There is no distension.  Palpations: Abdomen is soft.  Musculoskeletal:        General: Normal range of motion.     Cervical back: Normal range of motion.  Neurological:     General: No focal deficit present.     Mental Status: He is alert and oriented to person, place, and time.  Psychiatric:        Mood and Affect: Mood normal.     ED Results / Procedures / Treatments   Labs (all labs ordered are listed, but only abnormal results are displayed) Labs Reviewed  BASIC METABOLIC PANEL - Abnormal; Notable for the following components:      Result Value   Sodium 134 (*)    All other components within normal limits  CBC - Abnormal; Notable for the following components:   Hemoglobin 17.4 (*)    All other components within  normal limits  GLUCOSE, CAPILLARY  CBG MONITORING, ED  TROPONIN I (HIGH SENSITIVITY)  TROPONIN I (HIGH SENSITIVITY)    EKG None  Radiology DG Chest 2 View  Result Date: 01/09/2023 CLINICAL DATA:  Chest pain after moving boxes last night. EXAM: CHEST - 2 VIEW COMPARISON:  09/20/2022 FINDINGS: The heart size and mediastinal contours are within normal limits. Both lungs are clear. The visualized skeletal structures are unremarkable. IMPRESSION: No active cardiopulmonary disease. Electronically Signed   By: Kerby Moors M.D.   On: 01/09/2023 13:25    Procedures Procedures    Medications Ordered in ED Medications  sodium chloride 0.9 % bolus 1,000 mL (1,000 mLs Intravenous New Bag/Given 01/09/23 1641)  ondansetron (ZOFRAN) injection 4 mg (4 mg Intravenous Given 01/09/23 1638)  meclizine (ANTIVERT) tablet 25 mg (25 mg Oral Given 01/09/23 1645)    ED Course/ Medical Decision Making/ A&P                             Medical Decision Making Complains of dizziness after having 2 days of diarrhea.  Patient reports he has had shooting pain after lifting boxes yesterday  Amount and/or Complexity of Data Reviewed External Data Reviewed: notes.    Details: Review his ED notes reviewed Labs: ordered. Decision-making details documented in ED Course.    Details: Labs ordered reviewed and interpreted.  Hemoglobin is 17.   Radiology: ordered and independent interpretation performed. Decision-making details documented in ED Course.    Details: Chest xray no acute abnormality  ECG/medicine tests: ordered and independent interpretation performed. Decision-making details documented in ED Course.    Details: EKg  no acute abnormality    Risk Prescription drug management.           Final Clinical Impression(s) / ED Diagnoses Final diagnoses:  Vertigo  Chest pain, unspecified type    Rx / DC Orders ED Discharge Orders          Ordered    meclizine (ANTIVERT) 25 MG tablet  3 times  daily PRN        01/09/23 1853           An After Visit Summary was printed and given to the patient.    Sidney Ace 01/09/23 1854    Drenda Freeze, MD 01/09/23 [phone]

## 2023-01-09 NOTE — ED Triage Notes (Signed)
Pt c/o mid chest pain since moving boxes last pm; sts pain is worse w/ deep breath; also reports some lightheadedness and blurry vision since waking this morning

## 2023-01-09 NOTE — ED Notes (Signed)
Pt denies chest pain at this time, reports nausea comes and goes.

## 2023-01-09 NOTE — Discharge Instructions (Addendum)
Return if any problems.

## 2023-01-27 ENCOUNTER — Emergency Department (HOSPITAL_BASED_OUTPATIENT_CLINIC_OR_DEPARTMENT_OTHER)
Admission: EM | Admit: 2023-01-27 | Discharge: 2023-01-27 | Disposition: A | Discharge status: Home / Self Care | Payer: Self-pay | Attending: Emergency Medicine | Admitting: Emergency Medicine

## 2023-01-27 ENCOUNTER — Encounter (HOSPITAL_BASED_OUTPATIENT_CLINIC_OR_DEPARTMENT_OTHER): Payer: Self-pay

## 2023-01-27 DIAGNOSIS — Z202 Contact with and (suspected) exposure to infections with a predominantly sexual mode of transmission: Secondary | ICD-10-CM | POA: Insufficient documentation

## 2023-01-27 LAB — URINALYSIS, ROUTINE W REFLEX MICROSCOPIC
Bilirubin Urine: NEGATIVE
Glucose, UA: NEGATIVE mg/dL
Hgb urine dipstick: NEGATIVE
Ketones, ur: NEGATIVE mg/dL
Leukocytes,Ua: NEGATIVE
Nitrite: NEGATIVE
Protein, ur: NEGATIVE mg/dL
Specific Gravity, Urine: >=1.03 (ref 1.005–1.030)
pH: 6.5 (ref 5.0–8.0)

## 2023-01-27 MED ORDER — DOXYCYCLINE HYCLATE 100 MG PO TABS
100.0000 mg | ORAL_TABLET | Freq: Once | ORAL | Status: AC
  Administered 2023-01-27: 100 mg via ORAL
  Filled 2023-01-27: qty 1

## 2023-01-27 MED ORDER — DOXYCYCLINE HYCLATE 100 MG PO CAPS: 100.0000 mg | ORAL_CAPSULE | Freq: Two times a day (BID) | ORAL | 0 refills | Status: AC

## 2023-01-27 MED ORDER — LIDOCAINE HCL (PF) 1 % IJ SOLN
INTRAMUSCULAR | Status: AC
  Filled 2023-01-27: qty 5

## 2023-01-27 MED ORDER — CEFTRIAXONE SODIUM 500 MG IJ SOLR
500.0000 mg | Freq: Once | INTRAMUSCULAR | Status: AC
  Administered 2023-01-27: 500 mg via INTRAMUSCULAR
  Filled 2023-01-27: qty 500

## 2023-01-27 NOTE — ED Notes (Signed)
Pt in bed, pt denies pain or itchiness at injection site, states that he is ready to go home, pt verbalized understanding d/c and follow up, pt ambulatory from dpt.

## 2023-01-27 NOTE — Discharge Instructions (Signed)
Note the workup today was overall reassuring.  Follow MyChart for the results of your gonorrhea/chlamydia testing.  Take antibiotic as directed for the next 7 days twice daily.  Please avoid sexual intercourse until antibiotics are completed.  Please do not hesitate to return to emergency department if the worrisome signs and symptoms we discussed become apparent.

## 2023-01-27 NOTE — ED Provider Notes (Signed)
[facility] Provider Note   CSN: [id number] Arrival date & time: 01/27/23  [id number]     History  Chief Complaint  Patient presents with   Exposure to STD    Cameron Moore is a 27 y.o. male.   Exposure to STD   27 year old male emergency department with complaints of exposure to STD.  Patient states that his partner was recently symptomatic and tested positive for "at least chlamydia."  Patient currently denies fever, abdominal pain, nausea, vomiting, urinary symptoms, rash, discharge.  States he is interested in urine testing but wants no blood testing done.  No significant pertinent past medical history.  Home Medications Prior to Admission medications   Medication Sig Start Date End Date Taking? Authorizing Provider  doxycycline (VIBRAMYCIN) 100 MG capsule Take 1 capsule (100 mg total) by mouth 2 (two) times daily for 7 days. 01/27/23 02/03/23 Yes Dion Saucier A, PA  alum & mag hydroxide-simeth (MAALOX MAX) 400-400-40 MG/5ML suspension Take 10 mLs by mouth every 6 (six) hours as needed for indigestion. 09/23/22   Tegeler, Gwenyth Allegra, MD  chlorhexidine (PERIDEX) 0.12 % solution Take 15 mL and swish around your mouth for 30 seconds twice daily.  Spit it out after swishing. 09/22/22   Molpus, John, MD  diclofenac Sodium (VOLTAREN) 1 % GEL Apply 2 g topically 4 (four) times daily as needed. 09/20/22   Long, Wonda Olds, MD  diphenhydrAMINE-Phenylephrine (BENADRYL ALLERGY CHILDRENS) 12.5-5 MG/5ML SOLN Take 12.5 mg by mouth every 6 (six) hours as needed. Please mix 1 part Benadryl, 1 part Maalox, and 1 part viscous lidocaine then swish, gargle, and spit 1-2 teaspoonfuls every 4-6 hours as needed.  Do not swallow.  Shake well before eating. 09/23/22   Tegeler, Gwenyth Allegra, MD  lidocaine (XYLOCAINE) 2 % solution Swish 14m around the mouth, then spit out, every 3 hours as needed for pain 09/22/22   Molpus, JJenny Reichmann MD  lidocaine (XYLOCAINE) 2 %  solution Use as directed 15 mLs in the mouth or throat as needed for mouth pain. 09/23/22   Tegeler, CGwenyth Allegra MD  meclizine (ANTIVERT) 25 MG tablet Take 1 tablet (25 mg total) by mouth 3 (three) times daily as needed for dizziness. 01/09/23   SFransico Meadow PA-C      Allergies    Bee venom and Mushroom ext cmplx(shiitake-reishi-mait)    Review of Systems   Review of Systems  All other systems reviewed and are negative.   Physical Exam Updated Vital Signs BP 119/68 (BP Location: Right Arm)   Pulse 64   Temp 98.2 F (36.8 C) (Oral)   Resp 18   Ht 6' (1.829 m)   Wt 104.3 kg   SpO2 100%   BMI 31.19 kg/m  Physical Exam Vitals and nursing note reviewed.  Constitutional:      General: He is not in acute distress.    Appearance: He is well-developed.  HENT:     Head: Normocephalic and atraumatic.  Eyes:     Conjunctiva/sclera: Conjunctivae normal.  Cardiovascular:     Rate and Rhythm: Normal rate and regular rhythm.     Heart sounds: No murmur heard. Pulmonary:     Effort: Pulmonary effort is normal. No respiratory distress.     Breath sounds: Normal breath sounds.  Abdominal:     Palpations: Abdomen is soft.     Tenderness: There is no abdominal tenderness. There is no guarding or rebound.  Musculoskeletal:  General: No swelling.     Cervical back: Neck supple.  Skin:    General: Skin is warm and dry.     Capillary Refill: Capillary refill takes less than 2 seconds.  Neurological:     Mental Status: He is alert.  Psychiatric:        Mood and Affect: Mood normal.     ED Results / Procedures / Treatments   Labs (all labs ordered are listed, but only abnormal results are displayed) Labs Reviewed  URINALYSIS, ROUTINE W REFLEX MICROSCOPIC  GC/CHLAMYDIA PROBE AMP ([place]) NOT AT [facility]    EKG None  Radiology No results found.  Procedures Procedures    Medications Ordered in ED Medications  lidocaine (PF) (XYLOCAINE) 1 % injection (has no  administration in time range)  doxycycline (VIBRA-TABS) tablet 100 mg (100 mg Oral Given 01/27/23 0932)  cefTRIAXone (ROCEPHIN) injection 500 mg (500 mg Intramuscular Given 01/27/23 [id number])    ED Course/ Medical Decision Making/ A&P                             Medical Decision Making Amount and/or Complexity of Data Reviewed Labs: ordered.  Risk Prescription drug management.   This patient presents to the ED for concern of STD exposure, this involves an extensive number of treatment options, and is a complaint that carries with it a high risk of complications and morbidity.  The differential diagnosis includes gonorrhea, chlamydia, HIV, syphilis, HSV   Co morbidities that complicate the patient evaluation  See HPI   Additional history obtained:  Additional history obtained from EMR External records from outside source obtained and reviewed including hospital records   Lab Tests:  I Ordered, and personally interpreted labs.  The pertinent results include: Urinalysis without abnormality.  GC/committee a pending.   Imaging Studies ordered:  N/a   Cardiac Monitoring: / EKG:  The patient was maintained on a cardiac monitor.  I personally viewed and interpreted the cardiac monitored which showed an underlying rhythm of: Sinus rhythm   Consultations Obtained:  N/a   Problem List / ED Course / Critical interventions / Medication management  STD exposure I ordered medication including doxycycline, Rocephin, lidocaine   Reevaluation of the patient after these medicines showed that the patient stayed the same I have reviewed the patients home medicines and have made adjustments as needed   Social Determinants of Health:  Denies tobacco, illicit drug use   Test / Admission - Considered:  STD exposure Vitals signs within normal range and stable throughout visit. Laboratory studies significant for: See above To the ED with concerns of STD exposure.  Patient currently  asymptomatic.  Patient declined HIV as well as syphilis testing manage gonorrhea/chlamydia sample was sent off.  Urine without signs of infection/abnormalities.  Will treat empirically with Rocephin as well as doxycycline for coverage of gonorrhea/chlamydia.  Patient recommended follow-up with primary care for reassessment of symptoms.  Patient educated regarding abstinence of sexual intercourse until antibiotic therapy is completed.  Treatment plan discussed length with patient and he acknowledged understanding was agreeable to said plan. Worrisome signs and symptoms were discussed with the patient, and the patient acknowledged understanding to return to the ED if noticed. Patient was stable upon discharge.          Final Clinical Impression(s) / ED Diagnoses Final diagnoses:  STD exposure    Rx / DC Orders ED Discharge Orders  Ordered    doxycycline (VIBRAMYCIN) 100 MG capsule  2 times daily        01/27/23 0921              Wilnette Kales, PA 01/27/23 1002    Varney Biles, MD 01/27/23 1257

## 2023-01-27 NOTE — ED Triage Notes (Signed)
Having urinary urgency, discomfort with urination, pain in lower abdomen. Hx of UTI. Denies penile discharge, wants to get tested for STD/UTI. States recent partner positive for chlamydia.

## 2023-01-27 NOTE — ED Notes (Signed)
Pt ambulatory to room number 11, pt reports some lower abd pain and urinary frequency, denies pain or discharge, pt states that he would like to be tested for chlamydia.

## 2023-01-28 LAB — GC/CHLAMYDIA PROBE AMP (~~LOC~~) NOT AT ARMC
Chlamydia: POSITIVE — AB
Comment: NEGATIVE
Comment: NORMAL
Neisseria Gonorrhea: NEGATIVE

## 2023-04-18 ENCOUNTER — Emergency Department (HOSPITAL_BASED_OUTPATIENT_CLINIC_OR_DEPARTMENT_OTHER): Payer: No Typology Code available for payment source

## 2023-04-18 ENCOUNTER — Other Ambulatory Visit: Payer: Self-pay

## 2023-04-18 ENCOUNTER — Emergency Department (HOSPITAL_BASED_OUTPATIENT_CLINIC_OR_DEPARTMENT_OTHER)
Admission: EM | Admit: 2023-04-18 | Discharge: 2023-04-18 | Disposition: A | Discharge status: Home / Self Care | Payer: No Typology Code available for payment source | Attending: Emergency Medicine | Admitting: Emergency Medicine

## 2023-04-18 ENCOUNTER — Encounter (HOSPITAL_BASED_OUTPATIENT_CLINIC_OR_DEPARTMENT_OTHER): Payer: Self-pay | Admitting: Emergency Medicine

## 2023-04-18 DIAGNOSIS — Y92410 Unspecified street and highway as the place of occurrence of the external cause: Secondary | ICD-10-CM | POA: Diagnosis not present

## 2023-04-18 DIAGNOSIS — S4352XA Sprain of left acromioclavicular joint, initial encounter: Secondary | ICD-10-CM | POA: Insufficient documentation

## 2023-04-18 DIAGNOSIS — M25512 Pain in left shoulder: Secondary | ICD-10-CM | POA: Diagnosis present

## 2023-04-18 MED ORDER — KETOROLAC TROMETHAMINE 60 MG/2ML IM SOLN
30.0000 mg | Freq: Once | INTRAMUSCULAR | Status: AC
  Administered 2023-04-18: 30 mg via INTRAMUSCULAR
  Filled 2023-04-18: qty 2

## 2023-04-18 NOTE — Discharge Instructions (Addendum)
X-ray imaging of the shoulder and scapula was negative for acute fracture or dislocation.  You did have tenderness over the [facility] joint consistent with a likely AC sprain.  Recommend NSAIDs for pain control, utilize a sling for comfort as needed, follow-up outpatient with orthopedics/sports medicine.
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
## 2023-04-18 NOTE — ED Notes (Signed)
Patient transported to X-ray 

## 2023-04-18 NOTE — ED Notes (Signed)

## 2023-08-02 ENCOUNTER — Other Ambulatory Visit: Payer: Self-pay

## 2023-08-02 ENCOUNTER — Emergency Department (HOSPITAL_BASED_OUTPATIENT_CLINIC_OR_DEPARTMENT_OTHER)
Admission: EM | Admit: 2023-08-02 | Discharge: 2023-08-03 | Disposition: A | Discharge status: Home / Self Care | Payer: Self-pay | Attending: Emergency Medicine | Admitting: Emergency Medicine

## 2023-08-02 ENCOUNTER — Encounter (HOSPITAL_BASED_OUTPATIENT_CLINIC_OR_DEPARTMENT_OTHER): Payer: Self-pay

## 2023-08-02 DIAGNOSIS — S46912A Strain of unspecified muscle, fascia and tendon at shoulder and upper arm level, left arm, initial encounter: Secondary | ICD-10-CM | POA: Insufficient documentation

## 2023-08-02 DIAGNOSIS — X58XXXA Exposure to other specified factors, initial encounter: Secondary | ICD-10-CM | POA: Insufficient documentation

## 2023-08-02 DIAGNOSIS — K0889 Other specified disorders of teeth and supporting structures: Secondary | ICD-10-CM | POA: Insufficient documentation

## 2023-08-02 DIAGNOSIS — G8929 Other chronic pain: Secondary | ICD-10-CM | POA: Insufficient documentation

## 2023-08-02 NOTE — ED Triage Notes (Addendum)
Pt c/o left shoulder pain, has had this issue before (2 months ago) and was told to wear a sling and he says made it worse, now rt. Shoulder pain.  No injury states it could be strained.   No pain at present time, hurts worse at work or when lying down.    Also has dental pain

## 2023-08-03 ENCOUNTER — Emergency Department (HOSPITAL_BASED_OUTPATIENT_CLINIC_OR_DEPARTMENT_OTHER)
Admission: EM | Admit: 2023-08-03 | Discharge: 2023-08-03 | Disposition: A | Discharge status: Home / Self Care | Payer: Self-pay | Attending: Emergency Medicine | Admitting: Emergency Medicine

## 2023-08-03 ENCOUNTER — Other Ambulatory Visit: Payer: Self-pay

## 2023-08-03 ENCOUNTER — Encounter (HOSPITAL_BASED_OUTPATIENT_CLINIC_OR_DEPARTMENT_OTHER): Payer: Self-pay

## 2023-08-03 DIAGNOSIS — K1379 Other lesions of oral mucosa: Secondary | ICD-10-CM

## 2023-08-03 DIAGNOSIS — K08409 Partial loss of teeth, unspecified cause, unspecified class: Secondary | ICD-10-CM

## 2023-08-03 DIAGNOSIS — K91841 Postprocedural hemorrhage and hematoma of a digestive system organ or structure following other procedure: Secondary | ICD-10-CM | POA: Insufficient documentation

## 2023-08-03 MED ORDER — NAPROXEN 500 MG PO TABS: 500.0000 mg | ORAL_TABLET | Freq: Two times a day (BID) | ORAL | 0 refills | Status: AC

## 2023-08-03 MED ORDER — TRANEXAMIC ACID 1000 MG/10ML IV SOLN
500.0000 mg | Freq: Once | INTRAVENOUS | Status: DC
  Filled 2023-08-03: qty 10

## 2023-08-03 MED ORDER — NAPROXEN 250 MG PO TABS
500.0000 mg | ORAL_TABLET | Freq: Once | ORAL | Status: AC
  Administered 2023-08-03: 500 mg via ORAL
  Filled 2023-08-03: qty 2
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
## 2023-08-03 NOTE — Discharge Instructions (Signed)
Wait at least 1 hour prior to eating soft foods.  If the tooth extraction site begins to bleed after eating, wait at least 10 to 15 minutes to see if it will stop bleeding on its own.  Please call a dentist in the morning for further bleeding concerns, if the bleeding returns or worsens please return to the emergency department.

## 2023-08-03 NOTE — ED Provider Notes (Signed)
[place] [facility] Provider Note   CSN: [id number] Arrival date & time: 08/03/23  2136     History  Chief Complaint  Patient presents with   Dental Problem    Quade Keyo Opalewski is a 27 y.o. male without a significant past medical history who presents to emergency department for of bleeding at dental extraction site.  Patient stated that he had bilateral upper dental extractions around 3 PM today and was instructed to apply gauze change if saturated with blood.  Patient stated that he change the gauze about 5-6 times since 3pm.  Patient stated that when he removes the gauze he will see clots on the gauze but then his mouth will start to bleed again.       Home Medications Prior to Admission medications   Medication Sig Start Date End Date Taking? Authorizing Provider  naproxen (NAPROSYN) 500 MG tablet Take 1 tablet (500 mg total) by mouth 2 (two) times daily. 08/03/23   Pollyann Savoy, MD      Allergies    Bee venom and Mushroom ext cmplx(shiitake-reishi-mait)    Review of Systems   Review of Systems  HENT:  Positive for dental problem.   Respiratory:  Negative for shortness of breath.   Cardiovascular:  Negative for chest pain.  Skin:  Negative for color change and pallor.  Neurological:  Negative for dizziness and light-headedness.  Hematological:  Does not bruise/bleed easily.    Physical Exam Updated Vital Signs BP 113/70   Pulse (!) 52   Temp 97.8 F (36.6 C) (Oral)   Resp 15   Ht 6' (1.829 m)   Wt 122.5 kg   SpO2 100%   BMI 36.62 kg/m  Physical Exam Vitals reviewed.  Constitutional:      General: He is not in acute distress.    Appearance: He is not ill-appearing, toxic-appearing or diaphoretic.  HENT:     Head: Normocephalic.     Mouth/Throat:     Comments: Bilateral upper tooth extraction sites with.  There is trace amounts of blood present. Cardiovascular:     Rate and Rhythm: Normal rate and regular  rhythm.  Pulmonary:     Effort: Pulmonary effort is normal. No respiratory distress.     Breath sounds: Normal breath sounds.  Skin:    General: Skin is warm and dry.  Neurological:     Mental Status: He is alert and oriented to person, place, and time.  Psychiatric:        Mood and Affect: Mood normal.     ED Results / Procedures / Treatments   Labs (all labs ordered are listed, but only abnormal results are displayed) Labs Reviewed - No data to display  EKG None  Radiology No results found.  Procedures Procedures    Medications Ordered in ED Medications  tranexamic acid (CYKLOKAPRON) injection 500 mg (500 mg Topical Not Given 08/03/23 2258)    ED Course/ Medical Decision Making/ A&P                                 Medical Decision Making Patient is a 27 year old male presents for concerns of bleeding at his dental extraction sites today.  Physical exam shows trace amounts of blood at the bilateral upper extraction site.  When he remove the gauze, there was clots present on the gauze.  After 15 minutes without gauze or pressure applied,  the bleeding did stop.  He denies symptoms of acute anemia at this moment.  He is hemodynamically stable.  Patient is well-appearing and was discharged home with instructions to avoid food for least 1 hour and to only eat soft foods at this moment.  Patient was also instructed to call the dentist office in the morning and to wait 10 to 15 minutes after he begins to bleed again before he would apply pressure with gauze.  Patient was instructed to return if his bleeding worsens or he is unable to stop the bleeding.  Amount and/or Complexity of Data Reviewed Independent Historian:     Details: Patient   Risk Prescription drug management.           Final Clinical Impression(s) / ED Diagnoses Final diagnoses:  Oral bleeding  Status post tooth extraction    Rx / DC Orders ED Discharge Orders     None         Faith Rogue, DO 08/03/23 2326    Glyn Ade, MD 08/04/23 1557

## 2023-08-03 NOTE — ED Triage Notes (Signed)
Pt reports that he had two dental extractions done today and that he continues to have bleeding from th site.

## 2024-07-06 ENCOUNTER — Emergency Department (HOSPITAL_BASED_OUTPATIENT_CLINIC_OR_DEPARTMENT_OTHER)
Admission: EM | Admit: 2024-07-06 | Discharge: 2024-07-06 | Disposition: A | Discharge status: Home / Self Care | Payer: Self-pay | Attending: Emergency Medicine | Admitting: Emergency Medicine

## 2024-07-06 ENCOUNTER — Encounter (HOSPITAL_BASED_OUTPATIENT_CLINIC_OR_DEPARTMENT_OTHER): Payer: Self-pay

## 2024-07-06 ENCOUNTER — Other Ambulatory Visit: Payer: Self-pay

## 2024-07-06 DIAGNOSIS — R42 Dizziness and giddiness: Secondary | ICD-10-CM | POA: Insufficient documentation

## 2024-07-06 LAB — COMPREHENSIVE METABOLIC PANEL WITH GFR
ALT: 42 U/L (ref 0–44)
AST: 30 U/L (ref 15–41)
Albumin: 4.4 g/dL (ref 3.5–5.0)
Alkaline Phosphatase: 51 U/L (ref 38–126)
Anion gap: 11 (ref 5–15)
BUN: 17 mg/dL (ref 6–20)
CO2: 23 mmol/L (ref 22–32)
Calcium: 9.6 mg/dL (ref 8.9–10.3)
Chloride: 104 mmol/L (ref 98–111)
Creatinine, Ser: 1.12 mg/dL (ref 0.61–1.24)
GFR, Estimated: >60 mL/min (ref >60)
Glucose, Bld: 94 mg/dL (ref 70–99)
Potassium: 4.3 mmol/L (ref 3.5–5.1)
Sodium: 138 mmol/L (ref 135–145)
Total Bilirubin: 0.5 mg/dL (ref 0.0–1.2)
Total Protein: 7.4 g/dL (ref 6.5–8.1)

## 2024-07-06 LAB — URINALYSIS, ROUTINE W REFLEX MICROSCOPIC
Bilirubin Urine: NEGATIVE
Glucose, UA: NEGATIVE mg/dL
Hgb urine dipstick: NEGATIVE
Ketones, ur: NEGATIVE mg/dL
Leukocytes,Ua: NEGATIVE
Nitrite: NEGATIVE
Protein, ur: NEGATIVE mg/dL
Specific Gravity, Urine: 1.025 (ref 1.005–1.030)
pH: 5.5 (ref 5.0–8.0)

## 2024-07-06 LAB — CBC
HCT: 44.8 % (ref 39.0–52.0)
Hemoglobin: 15.5 g/dL (ref 13.0–17.0)
MCH: 31.1 pg (ref 26.0–34.0)
MCHC: 34.6 g/dL (ref 30.0–36.0)
MCV: 89.8 fL (ref 80.0–100.0)
Platelets: 269 K/uL (ref 150–400)
RBC: 4.99 MIL/uL (ref 4.22–5.81)
RDW: 12 % (ref 11.5–15.5)
WBC: 6 K/uL (ref 4.0–10.5)
nRBC: 0 % (ref 0.0–0.2)

## 2024-07-06 LAB — CK: Total CK: 393 U/L (ref 49–397)

## 2024-07-06 LAB — CBG MONITORING, ED: Glucose-Capillary: 105 mg/dL — ABNORMAL HIGH (ref 70–99)

## 2024-07-06 MED ORDER — MECLIZINE HCL 25 MG PO TABS: 25.0000 mg | ORAL_TABLET | Freq: Three times a day (TID) | ORAL | 0 refills | Status: AC | PRN

## 2024-07-06 MED ORDER — SODIUM CHLORIDE 0.9 % IV BOLUS
1000.0000 mL | Freq: Once | INTRAVENOUS | Status: AC
  Administered 2024-07-06: 1000 mL via INTRAVENOUS

## 2024-07-06 MED ORDER — MECLIZINE HCL 25 MG PO TABS
25.0000 mg | ORAL_TABLET | Freq: Once | ORAL | Status: AC
  Administered 2024-07-06: 25 mg via ORAL
  Filled 2024-07-06: qty 1

## 2024-07-06 NOTE — Discharge Instructions (Addendum)
 I would recommend you establish care with a primary care doctor.  Particularly need to see someone if your symptoms are not improving.  Return to the emergency room if you have any worsening symptoms.

## 2024-07-06 NOTE — ED Notes (Signed)
 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  Pt. Reports he felt dizzy after he bent over to place his shoes on his feet this morning.  Pt. Reports a history of Vertigo.   No nausea or vomiting.  Pt. Does say he has not eaten today.  Pt. In no distress.  Pt. Speaks clearly with good eye contact.

## 2024-07-06 NOTE — ED Provider Notes (Signed)
 [place] [facility] Provider Note   CSN: [id number] Arrival date & time: 07/06/24  1029     Patient presents with: Dizziness   Cameron Moore is a 28 y.o. male.   Patient is a 28 year old who presents with dizziness.  He states he first noticed it yesterday but did not pay too much attention to it.  This morning when he first got up he noticed that he was dizzy and then it continued through the day.  He describes it as lightheadedness but it is worse when he moves his head from side-to-side and happens both when he is laying down and when he stands up.  It is better if he fixes his eyes on a certain spot.  He denies any associated nausea and vomiting.  No headache or recent head trauma.  No fevers.  No numbness or weakness to his extremities.  No speech deficits or vision changes.  He said he has had dizziness before and was told it was vertigo.  He has been working outside in the heat but the dizziness started prior to that.       Prior to Admission medications   Medication Sig Start Date End Date Taking? Authorizing Provider  meclizine  (ANTIVERT ) 25 MG tablet Take 1 tablet (25 mg total) by mouth 3 (three) times daily as needed for dizziness. 07/06/24  Yes Cameron Hollering, MD  naproxen  (NAPROSYN ) 500 MG tablet Take 1 tablet (500 mg total) by mouth 2 (two) times daily. 08/03/23   Cameron Carlin NOVAK, MD    Allergies: Bee venom and Mushroom ext cmplx(shiitake-reishi-mait)    Review of Systems  Constitutional:  Negative for chills, diaphoresis, fatigue and fever.  HENT:  Negative for congestion, rhinorrhea and sneezing.   Eyes: Negative.   Respiratory:  Negative for cough, chest tightness and shortness of breath.   Cardiovascular:  Negative for chest pain and leg swelling.  Gastrointestinal:  Negative for abdominal pain, blood in stool, diarrhea, nausea and vomiting.  Genitourinary:  Negative for difficulty urinating, flank pain and frequency.   Musculoskeletal:  Negative for arthralgias and back pain.  Skin:  Negative for rash.  Neurological:  Positive for dizziness. Negative for speech difficulty, weakness, numbness and headaches.    Updated Vital Signs BP 101/60   Pulse (!) 58   Temp 97.9 F (36.6 C)   Resp 18   Wt 136.1 kg   SpO2 99%   BMI 40.69 kg/m   Physical Exam Constitutional:      Appearance: He is well-developed.  HENT:     Head: Normocephalic and atraumatic.  Eyes:     Extraocular Movements: Extraocular movements intact.     Pupils: Pupils are equal, round, and reactive to light.     Comments: Positive horizontal nystagmus with a fast component to the left.  No vertical or rotational nystagmus.  Cardiovascular:     Rate and Rhythm: Normal rate and regular rhythm.     Heart sounds: Normal heart sounds.  Pulmonary:     Effort: Pulmonary effort is normal. No respiratory distress.     Breath sounds: Normal breath sounds. No wheezing or rales.  Chest:     Chest wall: No tenderness.  Abdominal:     General: Bowel sounds are normal.     Palpations: Abdomen is soft.     Tenderness: There is no abdominal tenderness. There is no guarding or rebound.  Musculoskeletal:        General: Normal range of  motion.     Cervical back: Normal range of motion and neck supple.  Lymphadenopathy:     Cervical: No cervical adenopathy.  Skin:    General: Skin is warm and dry.     Findings: No rash.  Neurological:     Mental Status: He is alert and oriented to person, place, and time.     Comments: Motor 5/5 all extremities Sensation grossly intact to LT all extremities Finger to Nose intact, no pronator drift CN II-XII grossly intact       (all labs ordered are listed, but only abnormal results are displayed) Labs Reviewed  CBG MONITORING, ED - Abnormal; Notable for the following components:      Result Value   Glucose-Capillary 105 (*)    All other components within normal limits  COMPREHENSIVE METABOLIC  PANEL WITH GFR  CBC  URINALYSIS, ROUTINE W REFLEX MICROSCOPIC  CK    EKG: EKG Interpretation Date/Time:  Friday July 06 2024 10:38:16 EDT Ventricular Rate:  65 PR Interval:  164 QRS Duration:  83 QT Interval:  387 QTC Calculation: 403 R Axis:   59  Text Interpretation: Sinus rhythm Borderline ST elevation, lateral leads since last tracing no significant change Confirmed by Cameron Moore [phone]) on 07/06/2024 11:07:27 AM  Radiology: No results found.   Procedures   Medications Ordered in the ED  meclizine  (ANTIVERT ) tablet 25 mg (25 mg Oral Given 07/06/24 1153)  sodium chloride  0.9 % bolus 1,000 mL (0 mLs Intravenous Stopped 07/06/24 1309)                                    Medical Decision Making Amount and/or Complexity of Data Reviewed Labs: ordered.   This patient presents to the ED for concern of dizziness, this involves an extensive number of treatment options, and is a complaint that carries with it a high risk of complications and morbidity.  I considered the following differential and admission for this acute, potentially life threatening condition.  The differential diagnosis includes vertigo, posterior circulation stroke, dehydration, rhabdomyolysis, electrolyte abnormality, anemia, infection  MDM:    Patient is a 28 year old who presents with dizziness.  His symptoms sound vertiginous in nature and he does have some horizontal nystagmus.  His labs reviewed are nonconcerning.  Urine is not consistent with infection.  No evidence of rhabdomyolysis.  No significant anemia.  He was given IV fluids as well as some meclizine .  He is feeling much better.  He is able to ambulate without ataxia or ongoing dizziness.  Suspect he has peripheral vertigo.  He was discharged home in good condition.  He does not have symptoms that would be more concerning for posterior circulation stroke.  He was encouraged to establish care with a primary care doctor.  Return precautions were  given.  He was given a prescription for meclizine .  (Labs, imaging, consults)  Labs: I Ordered, and personally interpreted labs.  The pertinent results include: Electrolytes nonconcerning, no anemia  Imaging Studies ordered: I ordered imaging studies including none I independently visualized and interpreted imaging. I agree with the radiologist interpretation  Additional history obtained from chart.  External records from outside source obtained and reviewed including prior notes  Cardiac Monitoring: The patient was maintained on a cardiac monitor.  If on the cardiac monitor, I personally viewed and interpreted the cardiac monitored which showed an underlying rhythm of: Sinus rhythm  Reevaluation: After the  interventions noted above, I reevaluated the patient and found that they have :improved  Social Determinants of Health:  no primary care doctor  Disposition: Discharged to home  Co morbidities that complicate the patient evaluation History reviewed. No pertinent past medical history.   Medicines Meds ordered this encounter  Medications   meclizine  (ANTIVERT ) tablet 25 mg   sodium chloride  0.9 % bolus 1,000 mL   meclizine  (ANTIVERT ) 25 MG tablet    Sig: Take 1 tablet (25 mg total) by mouth 3 (three) times daily as needed for dizziness.    Dispense:  30 tablet    Refill:  0    I have reviewed the patients home medicines and have made adjustments as needed  Problem List / ED Course: Problem List Items Addressed This Visit   None Visit Diagnoses       Vertigo    -  Primary                Final diagnoses:  Vertigo    ED Discharge Orders          Ordered    meclizine  (ANTIVERT ) 25 MG tablet  3 times daily PRN        07/06/24 1416               Cameron Hollering, MD 07/06/24 1427

## 2024-07-06 NOTE — ED Triage Notes (Signed)
 Lightheaded, dizzy, fatigue while doing yardwork this morning. States he felt like he was going to pass out, but didn't  Denies current headache, NV   Ambulatory to triage room
